# Patient Record
Sex: Female | Born: 1945
Health system: Southern US, Community
[De-identification: ages and names within clinical notes are randomized; demographics above are authoritative.]

## PROBLEM LIST (undated history)

## (undated) DIAGNOSIS — M8580 Other specified disorders of bone density and structure, unspecified site: Secondary | ICD-10-CM

## (undated) DIAGNOSIS — K759 Inflammatory liver disease, unspecified: Secondary | ICD-10-CM

## (undated) DIAGNOSIS — I10 Essential (primary) hypertension: Secondary | ICD-10-CM

## (undated) DIAGNOSIS — E079 Disorder of thyroid, unspecified: Secondary | ICD-10-CM

## (undated) DIAGNOSIS — E785 Hyperlipidemia, unspecified: Secondary | ICD-10-CM

## (undated) DIAGNOSIS — M1990 Unspecified osteoarthritis, unspecified site: Secondary | ICD-10-CM

## (undated) DIAGNOSIS — E039 Hypothyroidism, unspecified: Secondary | ICD-10-CM

## (undated) HISTORY — PX: APPENDECTOMY: SHX54

## (undated) HISTORY — DX: Other specified disorders of bone density and structure, unspecified site: M85.80

## (undated) HISTORY — DX: Disorder of thyroid, unspecified: E07.9

## (undated) HISTORY — PX: VAGINAL HYSTERECTOMY: SUR661

## (undated) HISTORY — PX: TONSILLECTOMY AND ADENOIDECTOMY: SUR1326

## (undated) HISTORY — PX: HERNIA REPAIR: SHX51

## (undated) HISTORY — DX: Hyperlipidemia, unspecified: E78.5

## (undated) HISTORY — PX: LAPAROTOMY: SHX154

## (undated) HISTORY — PX: BREAST SURGERY: SHX581

---

## 1999-03-05 ENCOUNTER — Other Ambulatory Visit: Admission: RE | Admit: 1999-03-05 | Discharge: 1999-03-05 | Payer: Self-pay | Admitting: Obstetrics and Gynecology

## 2000-03-02 ENCOUNTER — Other Ambulatory Visit: Admission: RE | Admit: 2000-03-02 | Discharge: 2000-03-02 | Payer: Self-pay | Admitting: Obstetrics and Gynecology

## 2000-03-10 ENCOUNTER — Encounter: Admission: RE | Admit: 2000-03-10 | Discharge: 2000-03-10 | Payer: Self-pay | Admitting: Internal Medicine

## 2001-10-17 ENCOUNTER — Other Ambulatory Visit: Admission: RE | Admit: 2001-10-17 | Discharge: 2001-10-17 | Payer: Self-pay | Admitting: Radiology

## 2002-03-27 ENCOUNTER — Other Ambulatory Visit: Admission: RE | Admit: 2002-03-27 | Discharge: 2002-03-27 | Payer: Self-pay | Admitting: Obstetrics and Gynecology

## 2002-09-11 ENCOUNTER — Other Ambulatory Visit: Admission: RE | Admit: 2002-09-11 | Discharge: 2002-09-11 | Payer: Self-pay | Admitting: Obstetrics and Gynecology

## 2003-03-29 ENCOUNTER — Other Ambulatory Visit: Admission: RE | Admit: 2003-03-29 | Discharge: 2003-03-29 | Payer: Self-pay | Admitting: Obstetrics and Gynecology

## 2003-07-29 ENCOUNTER — Other Ambulatory Visit: Admission: RE | Admit: 2003-07-29 | Discharge: 2003-07-29 | Payer: Self-pay | Admitting: Obstetrics and Gynecology

## 2003-11-26 ENCOUNTER — Other Ambulatory Visit: Admission: RE | Admit: 2003-11-26 | Discharge: 2003-11-26 | Payer: Self-pay | Admitting: Obstetrics and Gynecology

## 2006-05-13 ENCOUNTER — Encounter: Admission: RE | Admit: 2006-05-13 | Discharge: 2006-05-13 | Payer: Self-pay | Admitting: Family Medicine

## 2011-08-24 HISTORY — PX: HAND SURGERY: SHX662

## 2012-01-19 ENCOUNTER — Emergency Department (HOSPITAL_COMMUNITY): Payer: BC Managed Care – PPO

## 2012-01-19 ENCOUNTER — Emergency Department (HOSPITAL_COMMUNITY): Payer: BC Managed Care – PPO | Admitting: Anesthesiology

## 2012-01-19 ENCOUNTER — Ambulatory Visit (HOSPITAL_COMMUNITY)
Admission: EM | Admit: 2012-01-19 | Discharge: 2012-01-20 | Disposition: A | Discharge status: Home / Self Care | Payer: BC Managed Care – PPO | Attending: Orthopedic Surgery | Admitting: Orthopedic Surgery

## 2012-01-19 ENCOUNTER — Encounter (HOSPITAL_COMMUNITY): Payer: Self-pay | Admitting: Anesthesiology

## 2012-01-19 ENCOUNTER — Encounter (HOSPITAL_COMMUNITY): Payer: Self-pay | Admitting: *Deleted

## 2012-01-19 ENCOUNTER — Encounter (HOSPITAL_COMMUNITY)
Admission: EM | Disposition: A | Discharge status: Home / Self Care | Payer: Self-pay | Source: Home / Self Care | Attending: Emergency Medicine

## 2012-01-19 DIAGNOSIS — S65509A Unspecified injury of blood vessel of unspecified finger, initial encounter: Secondary | ICD-10-CM | POA: Insufficient documentation

## 2012-01-19 DIAGNOSIS — S61409A Unspecified open wound of unspecified hand, initial encounter: Secondary | ICD-10-CM | POA: Insufficient documentation

## 2012-01-19 DIAGNOSIS — W260XXA Contact with knife, initial encounter: Secondary | ICD-10-CM | POA: Insufficient documentation

## 2012-01-19 DIAGNOSIS — I10 Essential (primary) hypertension: Secondary | ICD-10-CM | POA: Insufficient documentation

## 2012-01-19 HISTORY — DX: Essential (primary) hypertension: I10

## 2012-01-19 HISTORY — PX: I & D EXTREMITY: SHX5045

## 2012-01-19 LAB — POCT I-STAT, CHEM 8
BUN: 15 mg/dL (ref 6–23)
Calcium, Ion: 1.21 mmol/L (ref 1.12–1.32)
Chloride: 104 mEq/L (ref 96–112)
Creatinine, Ser: 0.7 mg/dL (ref 0.50–1.10)
Glucose, Bld: 114 mg/dL — ABNORMAL HIGH (ref 70–99)
HCT: 40 % (ref 36.0–46.0)
Hemoglobin: 13.6 g/dL (ref 12.0–15.0)
Potassium: 3.5 mEq/L (ref 3.5–5.1)
Sodium: 141 mEq/L (ref 135–145)
TCO2: 27 mmol/L (ref 0–100)

## 2012-01-19 LAB — CBC
HCT: 37.3 % (ref 36.0–46.0)
Hemoglobin: 12.5 g/dL (ref 12.0–15.0)
RDW: 13.1 % (ref 11.5–15.5)
WBC: 9.6 10*3/uL (ref 4.0–10.5)

## 2012-01-19 SURGERY — IRRIGATION AND DEBRIDEMENT EXTREMITY
Anesthesia: General | Site: Hand | Laterality: Left | Wound class: Clean

## 2012-01-19 MED ORDER — DEXAMETHASONE SODIUM PHOSPHATE 4 MG/ML IJ SOLN
INTRAMUSCULAR | Status: DC | PRN
  Administered 2012-01-19: 4 mg via INTRAVENOUS

## 2012-01-19 MED ORDER — SUCCINYLCHOLINE CHLORIDE 20 MG/ML IJ SOLN
INTRAMUSCULAR | Status: DC | PRN
  Administered 2012-01-19: 40 mg via INTRAVENOUS

## 2012-01-19 MED ORDER — DROPERIDOL 2.5 MG/ML IJ SOLN
INTRAMUSCULAR | Status: DC | PRN
  Administered 2012-01-19: 0.625 mg via INTRAVENOUS

## 2012-01-19 MED ORDER — EPHEDRINE SULFATE 50 MG/ML IJ SOLN
INTRAMUSCULAR | Status: DC | PRN
  Administered 2012-01-19 (×2): 5 mg via INTRAVENOUS
  Administered 2012-01-20: 10 mg via INTRAVENOUS

## 2012-01-19 MED ORDER — PROPOFOL 10 MG/ML IV EMUL
INTRAVENOUS | Status: DC | PRN
  Administered 2012-01-19: 50 mg via INTRAVENOUS
  Administered 2012-01-19: 200 mg via INTRAVENOUS

## 2012-01-19 MED ORDER — LACTATED RINGERS IV SOLN
INTRAVENOUS | Status: DC | PRN
  Administered 2012-01-19 – 2012-01-20 (×2): via INTRAVENOUS

## 2012-01-19 MED ORDER — LIDOCAINE HCL (CARDIAC) 20 MG/ML IV SOLN
INTRAVENOUS | Status: DC | PRN
  Administered 2012-01-19: 100 mg via INTRAVENOUS

## 2012-01-19 MED ORDER — ONDANSETRON HCL 4 MG/2ML IJ SOLN
INTRAMUSCULAR | Status: DC | PRN
  Administered 2012-01-19: 4 mg via INTRAVENOUS

## 2012-01-19 MED ORDER — VANCOMYCIN HCL IN DEXTROSE 1-5 GM/200ML-% IV SOLN
1000.0000 mg | Freq: Once | INTRAVENOUS | Status: AC
  Administered 2012-01-19: 1000 mg via INTRAVENOUS
  Filled 2012-01-19: qty 200

## 2012-01-19 MED ORDER — SUFENTANIL CITRATE 50 MCG/ML IV SOLN
INTRAVENOUS | Status: DC | PRN
  Administered 2012-01-19 (×2): 20 ug via INTRAVENOUS

## 2012-01-19 MED ORDER — MIDAZOLAM HCL 5 MG/5ML IJ SOLN
INTRAMUSCULAR | Status: DC | PRN
  Administered 2012-01-19: 2 mg via INTRAVENOUS

## 2012-01-19 MED ORDER — SODIUM CHLORIDE 0.9 % IR SOLN
Status: DC | PRN
  Administered 2012-01-19: 1000 mL

## 2012-01-19 SURGICAL SUPPLY — 53 items
BANDAGE CONFORM 2  STR LF (GAUZE/BANDAGES/DRESSINGS) IMPLANT
BANDAGE ELASTIC 3 VELCRO ST LF (GAUZE/BANDAGES/DRESSINGS) ×2 IMPLANT
BANDAGE ELASTIC 4 VELCRO ST LF (GAUZE/BANDAGES/DRESSINGS) ×2 IMPLANT
BANDAGE GAUZE ELAST BULKY 4 IN (GAUZE/BANDAGES/DRESSINGS) ×4 IMPLANT
BNDG CMPR 9X4 STRL LF SNTH (GAUZE/BANDAGES/DRESSINGS) ×1
BNDG ESMARK 4X9 LF (GAUZE/BANDAGES/DRESSINGS) ×1 IMPLANT
CLOTH BEACON ORANGE TIMEOUT ST (SAFETY) ×2 IMPLANT
CORDS BIPOLAR (ELECTRODE) ×1 IMPLANT
COVER SURGICAL LIGHT HANDLE (MISCELLANEOUS) ×4 IMPLANT
CUFF TOURNIQUET SINGLE 18IN (TOURNIQUET CUFF) ×2 IMPLANT
DECANTER SPIKE VIAL GLASS SM (MISCELLANEOUS) ×2 IMPLANT
DRAIN PENROSE 1/4X12 LTX STRL (WOUND CARE) IMPLANT
DRAPE OEC MINIVIEW 54X84 (DRAPES) IMPLANT
DRAPE SURG 17X23 STRL (DRAPES) ×2 IMPLANT
DRSG PAD ABDOMINAL 8X10 ST (GAUZE/BANDAGES/DRESSINGS) ×4 IMPLANT
DURAPREP 26ML APPLICATOR (WOUND CARE) IMPLANT
ELECT REM PT RETURN 9FT ADLT (ELECTROSURGICAL)
ELECTRODE REM PT RTRN 9FT ADLT (ELECTROSURGICAL) IMPLANT
GAUZE PACKING IODOFORM 1/4X5 (PACKING) IMPLANT
GAUZE XEROFORM 1X8 LF (GAUZE/BANDAGES/DRESSINGS) ×2 IMPLANT
GLOVE BIO SURGEON STRL SZ8.5 (GLOVE) ×3 IMPLANT
GOWN SRG XL XLNG 56XLVL 4 (GOWN DISPOSABLE) ×1 IMPLANT
GOWN STRL NON-REIN LRG LVL3 (GOWN DISPOSABLE) ×2 IMPLANT
GOWN STRL NON-REIN XL XLG LVL4 (GOWN DISPOSABLE) ×2
HANDPIECE INTERPULSE COAX TIP (DISPOSABLE)
KIT BASIN OR (CUSTOM PROCEDURE TRAY) ×2 IMPLANT
KIT ROOM TURNOVER OR (KITS) ×2 IMPLANT
MANIFOLD NEPTUNE II (INSTRUMENTS) ×2 IMPLANT
NDL HYPO 25GX1X1/2 BEV (NEEDLE) IMPLANT
NDL HYPO 25X1 1.5 SAFETY (NEEDLE) ×1 IMPLANT
NEEDLE HYPO 25GX1X1/2 BEV (NEEDLE) IMPLANT
NEEDLE HYPO 25X1 1.5 SAFETY (NEEDLE) ×2 IMPLANT
NS IRRIG 1000ML POUR BTL (IV SOLUTION) ×2 IMPLANT
PACK ORTHO EXTREMITY (CUSTOM PROCEDURE TRAY) ×2 IMPLANT
PAD ARMBOARD 7.5X6 YLW CONV (MISCELLANEOUS) ×4 IMPLANT
PAD CAST 4YDX4 CTTN HI CHSV (CAST SUPPLIES) ×2 IMPLANT
PADDING CAST COTTON 4X4 STRL (CAST SUPPLIES) ×4
SET HNDPC FAN SPRY TIP SCT (DISPOSABLE) IMPLANT
SPEAR EYE SURG WECK-CEL (MISCELLANEOUS) ×2 IMPLANT
SPONGE GAUZE 4X4 12PLY (GAUZE/BANDAGES/DRESSINGS) ×4 IMPLANT
SPONGE LAP 18X18 X RAY DECT (DISPOSABLE) ×1 IMPLANT
SUCTION FRAZIER TIP 10 FR DISP (SUCTIONS) ×1 IMPLANT
SUT ETHILON 9 0 BV130 4 (SUTURE) ×1 IMPLANT
SUT VICRYL RAPIDE 4/0 PS 2 (SUTURE) ×1 IMPLANT
SYR 20CC LL (SYRINGE) IMPLANT
SYR CONTROL 10ML LL (SYRINGE) ×2 IMPLANT
TOWEL OR 17X24 6PK STRL BLUE (TOWEL DISPOSABLE) ×2 IMPLANT
TOWEL OR 17X26 10 PK STRL BLUE (TOWEL DISPOSABLE) ×2 IMPLANT
TUBE ANAEROBIC SPECIMEN COL (MISCELLANEOUS) IMPLANT
TUBE CONNECTING 12X1/4 (SUCTIONS) ×2 IMPLANT
UNDERPAD 30X30 INCONTINENT (UNDERPADS AND DIAPERS) ×2 IMPLANT
WATER STERILE IRR 1000ML POUR (IV SOLUTION) ×2 IMPLANT
YANKAUER SUCT BULB TIP NO VENT (SUCTIONS) ×3 IMPLANT

## 2012-01-19 NOTE — Anesthesia Preprocedure Evaluation (Addendum)
Anesthesia Evaluation  Patient identified by MRN, date of birth, ID band Patient awake    Reviewed: Allergy & Precautions, H&P , NPO status , Patient's Chart, lab work & pertinent test results  History of Anesthesia Complications (+) AWARENESS UNDER ANESTHESIA  Airway Mallampati: I TM Distance: >3 FB Neck ROM: full    Dental  (+) Teeth Intact   Pulmonary          Cardiovascular Exercise Tolerance: Good hypertension, Rhythm:regular Rate:Normal     Neuro/Psych    GI/Hepatic   Endo/Other    Renal/GU      Musculoskeletal   Abdominal   Peds  Hematology   Anesthesia Other Findings   Reproductive/Obstetrics                           Anesthesia Physical Anesthesia Plan  ASA: II  Anesthesia Plan: General   Post-op Pain Management:    Induction: Intravenous  Airway Management Planned: Oral ETT  Additional Equipment:   Intra-op Plan:   Post-operative Plan:   Informed Consent: I have reviewed the patients History and Physical, chart, labs and discussed the procedure including the risks, benefits and alternatives for the proposed anesthesia with the patient or authorized representative who has indicated his/her understanding and acceptance.     Plan Discussed with: CRNA, Anesthesiologist and Surgeon  Anesthesia Plan Comments:        Anesthesia Quick Evaluation

## 2012-01-19 NOTE — ED Notes (Signed)
Dressing re-enforced due to bleeding

## 2012-01-19 NOTE — ED Provider Notes (Signed)
Medical screening examination/treatment/procedure(s) were conducted as a shared visit with non-physician practitioner(s) and myself.  I personally evaluated the patient during the encounter  Bleeding persists, will have hand surgeon to eval  Toy Baker, MD 01/19/12 2149

## 2012-01-19 NOTE — ED Notes (Signed)
Pt separating hamburger, knife slipped and punctured palm left hand, bleed large amt, pressure bandage applied to control bleeding

## 2012-01-19 NOTE — ED Provider Notes (Signed)
History     CSN: [id number]  Arrival date & time 01/19/12  1946   First MD Initiated Contact with Patient 01/19/12 2015      Chief Complaint  Patient presents with  . Extremity Laceration    (Consider location/radiation/quality/duration/timing/severity/associated sxs/prior treatment) Patient is a 66 y.o. female presenting with skin laceration. The history is provided by the patient. No language interpreter was used.  Laceration  The incident occurred less than 1 hour ago. Pain location: L hand. The laceration is 3 cm in size. The laceration mechanism was a a clean knife. The pain is at a severity of 6/10. The pain is moderate. The pain has been constant since onset. Her tetanus status is UTD.   L handed patient is here today after trying to separate 2 frozen hamburgers with a butcher knife.  The knife ended up going through her hand almost all the way.  Bleeding is not controlled.  Will put pressure dressing on to control bleeding.  Tetanus up to date.  Patient is an [employer].  pmh hypertension.  Last time she ate was noon today.     Past Medical History  Diagnosis Date  . Hypertension     Past Surgical History  Procedure Date  . Appendectomy     No family history on file.  History  Substance Use Topics  . Smoking status: Never Smoker   . Smokeless tobacco: Not on file  . Alcohol Use: Yes     occ    OB History    Grav Para Term Preterm Abortions TAB SAB Ect Mult Living                  Review of Systems  Constitutional: Negative.   HENT: Negative.   Eyes: Negative.   Respiratory: Negative.   Cardiovascular: Negative.   Gastrointestinal: Negative.   Skin:       L hand lac   Neurological: Negative.   Psychiatric/Behavioral: Negative.   All other systems reviewed and are negative.    Allergies  Sulfa drugs cross reactors and Penicillins  Home Medications   Current Outpatient Rx  Name Route Sig Dispense Refill  . ASPIRIN EC 81 MG PO TBEC Oral Take 81 mg  by mouth daily.    Marland Kitchen CALCIUM CARBONATE 600 MG PO TABS Oral Take 1,200 mg by mouth daily.    Marland Kitchen FOLIC ACID 1 MG PO TABS Oral Take 1 mg by mouth daily.    Marland Kitchen HYDROCHLOROTHIAZIDE 12.5 MG PO CAPS Oral Take 12.5 mg by mouth daily.    Marland Kitchen LEVOTHYROXINE SODIUM 88 MCG PO TABS Oral Take 88 mcg by mouth daily.    . OMEGA-3-ACID ETHYL ESTERS 1 G PO CAPS Oral Take 2 g by mouth daily.      BP 167/83  Pulse 81  Temp(Src) 98.3 F (36.8 C) (Oral)  Resp 16  SpO2 100%  Physical Exam  Nursing note and vitals reviewed. Constitutional: She is oriented to person, place, and time. She appears well-developed and well-nourished.  HENT:  Head: Normocephalic and atraumatic.  Eyes: Conjunctivae and EOM are normal. Pupils are equal, round, and reactive to light.  Neck: Normal range of motion. Neck supple.  Cardiovascular: Normal rate.   Pulmonary/Chest: Effort normal.  Abdominal: Soft.  Musculoskeletal: Normal range of motion. She exhibits no edema and no tenderness.  Neurological: She is alert and oriented to person, place, and time. She has normal reflexes.  Skin: Skin is warm and dry.  3cm L hand laceration with arterial bleed Blood is squirting out.   Psychiatric: She has a normal mood and affect.    ED Course  Procedures (including critical care time)  Labs Reviewed - No data to display Dg Hand Complete Left  01/19/2012  *RADIOLOGY REPORT*  Clinical Data: The stab wound to the palmar aspect of the left hand.  Severe bleeding.  LEFT HAND - COMPLETE 3+ VIEW  Comparison: None  Findings: Overlying gauze material obscures bony detail.  The there are diffuse degenerative changes throughout the interphalangeal joints, the first metacarpal phalangeal joint, STT joints, first carpometacarpal joint, and radiocarpal joints.  No evidence of acute fracture or subluxation.  No focal bone lesion or bone destruction.  Bone cortex and trabecular architecture appear intact.  No radiopaque foreign bodies are appreciated.   IMPRESSION: Degenerative changes in the left hand.  No acute bony abnormalities.  No radiopaque soft tissue foreign bodies.  Original Report Authenticated By: Marlon Pel, M.D.     1. Laceration of hand with complication       MDM  Laceration to L palm of hand. Bleeding not controlled after pressure dressing x 1 hour and clotting medication used with no results.  Patient will be transported to [facility] OR to Dr. Mina Marble for repair.  Patient is L handed.  1gm vancomycin given in the ER.           Remi Haggard, NP 01/20/12 1331

## 2012-01-19 NOTE — ED Notes (Signed)
Dressing re enforced prior to leaving with [facility]. Pt alert and oriented, EMTALA signed.

## 2012-01-19 NOTE — Consult Note (Signed)
Reason for Consult:left hand laceration Referring Physician: MIKENZIE MCCANNON is an 66 y.o. female.  HPI: s/p deep lac to left hand with obvious arterial bleed  Past Medical History  Diagnosis Date  . Hypertension     Past Surgical History  Procedure Date  . Appendectomy     No family history on file.  Social History:  reports that she has never smoked. She does not have any smokeless tobacco history on file. She reports that she drinks alcohol. She reports that she does not use illicit drugs.  Allergies:  Allergies  Allergen Reactions  . Sulfa Drugs Cross Reactors   . Penicillins     Medications: I have reviewed the patient's current medications.  Results for orders placed during the hospital encounter of 01/19/12 (from the past 48 hour(s))  POCT I-STAT, CHEM 8     Status: Abnormal   Collection Time   01/19/12 10:57 PM      Component Value Range Comment   Sodium 141  135 - 145 (mEq/L)    Potassium 3.5  3.5 - 5.1 (mEq/L)    Chloride 104  96 - 112 (mEq/L)    BUN 15  6 - 23 (mg/dL)    Creatinine, Ser 1.61  0.50 - 1.10 (mg/dL)    Glucose, Bld 096 (*) 70 - 99 (mg/dL)    Calcium, Ion 0.45  1.12 - 1.32 (mmol/L)    TCO2 27  0 - 100 (mmol/L)    Hemoglobin 13.6  12.0 - 15.0 (g/dL)    HCT 40.9  81.1 - 91.4 (%)     Dg Hand Complete Left  01/19/2012  *RADIOLOGY REPORT*  Clinical Data: The stab wound to the palmar aspect of the left hand.  Severe bleeding.  LEFT HAND - COMPLETE 3+ VIEW  Comparison: None  Findings: Overlying gauze material obscures bony detail.  The there are diffuse degenerative changes throughout the interphalangeal joints, the first metacarpal phalangeal joint, STT joints, first carpometacarpal joint, and radiocarpal joints.  No evidence of acute fracture or subluxation.  No focal bone lesion or bone destruction.  Bone cortex and trabecular architecture appear intact.  No radiopaque foreign bodies are appreciated.  IMPRESSION: Degenerative changes in the  left hand.  No acute bony abnormalities.  No radiopaque soft tissue foreign bodies.  Original Report Authenticated By: Marlon Pel, M.D.    Review of Systems  All other systems reviewed and are negative.   Blood pressure 167/83, pulse 81, temperature 98.3 F (36.8 C), temperature source Oral, resp. rate 16, SpO2 100.00%. Physical Exam  Constitutional: She is oriented to person, place, and time. She appears well-developed and well-nourished.  HENT:  Head: Normocephalic and atraumatic.  Cardiovascular: Normal rate.   Respiratory: Effort normal.  Musculoskeletal:       Hands: Neurological: She is alert and oriented to person, place, and time.  Skin: Skin is warm.  Psychiatric: She has a normal mood and affect. Her speech is normal and behavior is normal. Thought content normal.    Assessment/Plan: As above  Plan explore and repair as needed  Pratham Cassatt A 01/19/2012, 11:05 PM

## 2012-01-20 ENCOUNTER — Encounter (HOSPITAL_COMMUNITY): Payer: Self-pay | Admitting: Orthopedic Surgery

## 2012-01-20 MED ORDER — ONDANSETRON HCL 4 MG/2ML IJ SOLN: 4.0000 mg | Freq: Once | INTRAMUSCULAR | Status: DC | PRN

## 2012-01-20 MED ORDER — BUPIVACAINE HCL (PF) 0.25 % IJ SOLN
INTRAMUSCULAR | Status: DC | PRN
  Administered 2012-01-20: 6 mL

## 2012-01-20 MED ORDER — HYDROMORPHONE HCL PF 1 MG/ML IJ SOLN: 0.2500 mg | INTRAMUSCULAR | Status: DC | PRN

## 2012-01-20 NOTE — Transfer of Care (Signed)
Immediate Anesthesia Transfer of Care Note  Patient: Kristi Jones  Procedure(s) Performed: Procedure(s) (LRB): IRRIGATION AND DEBRIDEMENT EXTREMITY (Left)  Patient Location: PACU  Anesthesia Type: General  Level of Consciousness: awake, alert , oriented, patient cooperative and responds to stimulation  Airway & Oxygen Therapy: Patient Spontanous Breathing and Patient connected to nasal cannula oxygen  Post-op Assessment: Report given to PACU RN, Post -op Vital signs reviewed and stable and Patient moving all extremities  Post vital signs: Reviewed and stable  Complications: No apparent anesthesia complications

## 2012-01-20 NOTE — Anesthesia Postprocedure Evaluation (Signed)
  Anesthesia Post-op Note  Patient: Kristi Jones  Procedure(s) Performed: Procedure(s) (LRB): IRRIGATION AND DEBRIDEMENT EXTREMITY (Left)  Patient Location: PACU  Anesthesia Type: General  Level of Consciousness: awake, oriented and patient cooperative  Airway and Oxygen Therapy: Patient Spontanous Breathing and Patient connected to nasal cannula oxygen  Post-op Pain: mild  Post-op Assessment: Post-op Vital signs reviewed, Patient's Cardiovascular Status Stable, Respiratory Function Stable, Patent Airway, No signs of Nausea or vomiting and Pain level controlled  Post-op Vital Signs: stable  Complications: No apparent anesthesia complications

## 2012-01-20 NOTE — Op Note (Signed)
Kristi Jones, Kristi Jones            ACCOUNT NO.:  [id number]  MEDICAL RECORD NO.:  [id number]  LOCATION:  [facility]                         FACILITY:  [facility]  PHYSICIAN:  Artist Pais. Zacchary Pompei, M.D.DATE OF BIRTH:  [birth year]  DATE OF PROCEDURE:  01/19/2012 DATE OF DISCHARGE:                              OPERATIVE REPORT   PREOPERATIVE DIAGNOSIS:  Deep laceration, palmar aspect, left hand.  POSTOPERATIVE DIAGNOSIS:  Deep laceration, palmar aspect, left hand.  PROCEDURE:  Exploration and microscopic repair of common digital artery to the index and long.  SURGEON:  Artist Pais. Mina Marble, M.D.  ASSISTANT:  None.  ANESTHESIA:  General.  COMPLICATIONS:  No complications.  DRAINS:  No drains.  DESCRIPTION OF PROCEDURE:  The patient was taken to the operating suite. After induction of adequate general anesthesia, left upper extremity was prepped and draped in sterile fashion.  An Esmarch was used to exsanguinate the limb.  Tourniquet was inflated to 250 mmHg.  At this point in time, an oblique incision in the palmar aspect of the left hand.  A Kaplan's cardinal line was extended proximally and distally in the area of the thenar crease, and dissection was carried down to reveal a complete laceration of the common digital artery as was coming off the superficial palmar arch to the index and long.  It was debrided of clot under loupe magnification.  The microscope was brought into the field. Under microscopic magnification, the artery was repaired with 9-0 nylon x5 sutures, the repair was good, and the wound was irrigated and then microscope was brought off the field and it was loosely closed with 4-0 Vicryl Rapide suture.  Xeroform, 4x4s, fluffs, and a volar splint was applied.  The patient tolerated the procedure well and went to the recovery room in stable fashion.     Artist Pais Mina Marble, M.D.     MAW/MEDQ  D:  01/20/2012  T:  01/20/2012  Job:  [id number]

## 2012-01-20 NOTE — Brief Op Note (Signed)
01/19/2012 - 01/20/2012  2:08 PM  PATIENT:  Delon Sacramento  66 y.o. female  PRE-OPERATIVE DIAGNOSIS:  Hand Injury  POST-OPERATIVE DIAGNOSIS:  hand injury  PROCEDURE:  Procedure(s) (LRB): IRRIGATION AND DEBRIDEMENT EXTREMITY (Left)  SURGEON:  Surgeon(s) and Role:    * Marlowe Shores, MD - Primary  PHYSICIAN ASSISTANT:   ASSISTANTS: none   ANESTHESIA:   general  EBL:     BLOOD ADMINISTERED:none  DRAINS: none   LOCAL MEDICATIONS USED:  NONE  SPECIMEN:  No Specimen  DISPOSITION OF SPECIMEN:  N/A  COUNTS:  YES  TOURNIQUET:   Total Tourniquet Time Documented: Upper Arm (Left) - 44 minutes  DICTATION: .Other Dictation: Dictation Number [id number]  PLAN OF CARE: Discharge to home after PACU  PATIENT DISPOSITION:  PACU - hemodynamically stable.   Delay start of Pharmacological VTE agent (>24hrs) due to surgical blood loss or risk of bleeding: not applicable

## 2012-01-20 NOTE — Op Note (Signed)
See dictated note [phone]

## 2012-01-20 NOTE — Anesthesia Postprocedure Evaluation (Signed)
  Anesthesia Post-op Note  Patient: Kristi Jones  Procedure(s) Performed: Procedure(s) (LRB): IRRIGATION AND DEBRIDEMENT EXTREMITY (Left)  Patient Location: PACU  Anesthesia Type: General  Level of Consciousness: awake, alert , oriented and patient cooperative  Airway and Oxygen Therapy: Patient Spontanous Breathing and Patient connected to nasal cannula oxygen  Post-op Pain: mild  Post-op Assessment: Post-op Vital signs reviewed, Patient's Cardiovascular Status Stable, Respiratory Function Stable, Patent Airway, No signs of Nausea or vomiting and Pain level controlled  Post-op Vital Signs: stable  Complications: No apparent anesthesia complications

## 2012-01-20 NOTE — Discharge Instructions (Signed)
Do not change dressing

## 2012-01-22 NOTE — ED Provider Notes (Signed)
Medical screening examination/treatment/procedure(s) were performed by non-physician practitioner and as supervising physician I was immediately available for consultation/collaboration.  Osa Fogarty T Yousuf Ager, MD 01/22/12 1510 

## 2012-06-15 ENCOUNTER — Encounter (INDEPENDENT_AMBULATORY_CARE_PROVIDER_SITE_OTHER): Payer: Self-pay | Admitting: Surgery

## 2012-06-15 ENCOUNTER — Ambulatory Visit (INDEPENDENT_AMBULATORY_CARE_PROVIDER_SITE_OTHER): Payer: BC Managed Care – PPO | Admitting: Surgery

## 2012-06-15 VITALS — BP 132/82 | HR 63 | Temp 98.7°F | Resp 18 | Ht 66.0 in | Wt 143.6 lb

## 2012-06-15 DIAGNOSIS — L723 Sebaceous cyst: Secondary | ICD-10-CM

## 2012-06-15 NOTE — Progress Notes (Signed)
[facility]  Ovidio Kin, MD,  FACS [address] [place]  [address] [place], [place]    [address] Phone:  [phone] FAX:  [phone]   Re:   ANGELIN Jones DOB:   04-Nov-1962 MRN:   [id number]  ASSESSMENT AND PLAN: 1.  Sebaceous cyst, Left shoulder.  Discussed indications and complication of removing the cyst explained to the patient.  I gave her a sheet on sebaceous cysts.  [facility].  2. HTN 3.  Thyroid replacement  HISTORY OF PRESENT ILLNESS: Chief Complaint  Patient presents with  . New Evaluation    seb. cyst    Kristi Jones is a 66 y.o. (DOB: October 13, 1945)  white female who is a patient of DEWEY,ELIZABETH, MD and comes to me today for cyst of her back  I actually saw her in Sept. 2005 for an infected cyst of left shoulder.  For some reason, she never followed up with me.  This cyst healed, but then slowly got enlarged again.  Then it recently drained, so she is at a question of what to do about it.  I went over sebaceous cysts with her.  PHYSICAL EXAM: BP 132/82  Pulse 63  Temp 98.7 F (37.1 C) (Oral)  Resp 18  Ht 5\' 6"  (1.676 m)  Wt 143 lb 9.6 oz (65.137 kg)  BMI 23.18 kg/m2  Back:  2 cm cyst off left shoulder.  Not infected.  DATA REVIEWED: Notes from Cameron Sprang (with Dr. Duanne Guess)  Ovidio Kin, MD, FACS Office:  [phone]

## 2012-06-16 LAB — HM COLONOSCOPY

## 2012-06-21 ENCOUNTER — Other Ambulatory Visit (INDEPENDENT_AMBULATORY_CARE_PROVIDER_SITE_OTHER): Payer: Self-pay | Admitting: Surgery

## 2012-06-21 DIAGNOSIS — L723 Sebaceous cyst: Secondary | ICD-10-CM

## 2012-07-13 ENCOUNTER — Ambulatory Visit (INDEPENDENT_AMBULATORY_CARE_PROVIDER_SITE_OTHER): Payer: BC Managed Care – PPO | Admitting: Surgery

## 2012-07-13 ENCOUNTER — Encounter (INDEPENDENT_AMBULATORY_CARE_PROVIDER_SITE_OTHER): Payer: Self-pay | Admitting: Surgery

## 2012-07-13 VITALS — BP 124/74 | HR 68 | Temp 98.6°F | Resp 18 | Ht 66.0 in | Wt 142.0 lb

## 2012-07-13 DIAGNOSIS — L723 Sebaceous cyst: Secondary | ICD-10-CM

## 2012-07-13 NOTE — Progress Notes (Signed)
Post Op Visit  Excision of cyst of left shoulder on 06/21/2012 at the [facility]. Benign cyst - path to patient.  BP 124/74  Pulse 68  Temp 98.6 F (37 C) (Oral)  Resp 18  Ht 5\' 6"  (1.676 m)  Wt 142 lb (64.411 kg)  BMI 22.92 kg/m2 Doing well.  Return PRN.  Ovidio Kin, MD, [facility] Surgery Pager: [phone] Office phone:  [phone]

## 2013-10-31 DIAGNOSIS — I10 Essential (primary) hypertension: Secondary | ICD-10-CM | POA: Insufficient documentation

## 2013-10-31 DIAGNOSIS — E039 Hypothyroidism, unspecified: Secondary | ICD-10-CM | POA: Insufficient documentation

## 2013-10-31 DIAGNOSIS — M810 Age-related osteoporosis without current pathological fracture: Secondary | ICD-10-CM | POA: Insufficient documentation

## 2013-10-31 DIAGNOSIS — M8580 Other specified disorders of bone density and structure, unspecified site: Secondary | ICD-10-CM

## 2013-10-31 HISTORY — DX: Other specified disorders of bone density and structure, unspecified site: M85.80

## 2014-05-10 DIAGNOSIS — E785 Hyperlipidemia, unspecified: Secondary | ICD-10-CM | POA: Insufficient documentation

## 2014-05-10 DIAGNOSIS — E782 Mixed hyperlipidemia: Secondary | ICD-10-CM | POA: Insufficient documentation

## 2014-11-08 DIAGNOSIS — D242 Benign neoplasm of left breast: Secondary | ICD-10-CM | POA: Insufficient documentation

## 2016-02-19 LAB — HM HEPATITIS C SCREENING LAB: HM Hepatitis Screen: NEGATIVE

## 2017-01-24 DIAGNOSIS — I10 Essential (primary) hypertension: Secondary | ICD-10-CM | POA: Diagnosis not present

## 2017-01-24 DIAGNOSIS — M810 Age-related osteoporosis without current pathological fracture: Secondary | ICD-10-CM | POA: Diagnosis not present

## 2017-01-24 DIAGNOSIS — E782 Mixed hyperlipidemia: Secondary | ICD-10-CM | POA: Diagnosis not present

## 2017-01-24 DIAGNOSIS — E039 Hypothyroidism, unspecified: Secondary | ICD-10-CM | POA: Diagnosis not present

## 2017-01-31 DIAGNOSIS — H2513 Age-related nuclear cataract, bilateral: Secondary | ICD-10-CM | POA: Diagnosis not present

## 2017-02-11 ENCOUNTER — Encounter: Payer: Self-pay | Admitting: Family Medicine

## 2017-02-11 DIAGNOSIS — Z803 Family history of malignant neoplasm of breast: Secondary | ICD-10-CM | POA: Diagnosis not present

## 2017-02-11 DIAGNOSIS — M8589 Other specified disorders of bone density and structure, multiple sites: Secondary | ICD-10-CM | POA: Diagnosis not present

## 2017-02-11 DIAGNOSIS — Z1231 Encounter for screening mammogram for malignant neoplasm of breast: Secondary | ICD-10-CM | POA: Diagnosis not present

## 2017-02-24 DIAGNOSIS — R05 Cough: Secondary | ICD-10-CM | POA: Diagnosis not present

## 2017-02-24 DIAGNOSIS — I10 Essential (primary) hypertension: Secondary | ICD-10-CM | POA: Diagnosis not present

## 2017-05-05 DIAGNOSIS — I10 Essential (primary) hypertension: Secondary | ICD-10-CM | POA: Diagnosis not present

## 2017-05-18 LAB — HM DEXA SCAN

## 2017-05-18 LAB — HM MAMMOGRAPHY

## 2017-06-09 DIAGNOSIS — H35369 Drusen (degenerative) of macula, unspecified eye: Secondary | ICD-10-CM | POA: Diagnosis not present

## 2017-06-09 DIAGNOSIS — H2513 Age-related nuclear cataract, bilateral: Secondary | ICD-10-CM | POA: Diagnosis not present

## 2017-06-09 DIAGNOSIS — H04123 Dry eye syndrome of bilateral lacrimal glands: Secondary | ICD-10-CM | POA: Diagnosis not present

## 2017-09-27 DIAGNOSIS — I10 Essential (primary) hypertension: Secondary | ICD-10-CM | POA: Diagnosis not present

## 2017-09-27 DIAGNOSIS — Z0000 Encounter for general adult medical examination without abnormal findings: Secondary | ICD-10-CM | POA: Diagnosis not present

## 2018-01-10 ENCOUNTER — Ambulatory Visit (INDEPENDENT_AMBULATORY_CARE_PROVIDER_SITE_OTHER): Payer: Medicare Other | Admitting: Family Medicine

## 2018-01-10 ENCOUNTER — Encounter: Payer: Self-pay | Admitting: Family Medicine

## 2018-01-10 ENCOUNTER — Other Ambulatory Visit: Payer: Self-pay

## 2018-01-10 VITALS — BP 122/78 | HR 89 | Temp 98.8°F | Ht 65.0 in | Wt 140.4 lb

## 2018-01-10 DIAGNOSIS — E782 Mixed hyperlipidemia: Secondary | ICD-10-CM

## 2018-01-10 DIAGNOSIS — E039 Hypothyroidism, unspecified: Secondary | ICD-10-CM | POA: Diagnosis not present

## 2018-01-10 DIAGNOSIS — I10 Essential (primary) hypertension: Secondary | ICD-10-CM

## 2018-01-10 DIAGNOSIS — M810 Age-related osteoporosis without current pathological fracture: Secondary | ICD-10-CM

## 2018-01-10 LAB — CBC WITH DIFFERENTIAL/PLATELET
BASOS PCT: 0.4 % (ref 0.0–3.0)
Basophils Absolute: 0 10*3/uL (ref 0.0–0.1)
EOS PCT: 3.1 % (ref 0.0–5.0)
Eosinophils Absolute: 0.3 10*3/uL (ref 0.0–0.7)
HCT: 41.1 % (ref 36.0–46.0)
Hemoglobin: 13.9 g/dL (ref 12.0–15.0)
LYMPHS PCT: 18.6 % (ref 12.0–46.0)
Lymphs Abs: 1.5 10*3/uL (ref 0.7–4.0)
MCHC: 33.7 g/dL (ref 30.0–36.0)
MCV: 87.1 fl (ref 78.0–100.0)
MONOS PCT: 5.7 % (ref 3.0–12.0)
Monocytes Absolute: 0.5 10*3/uL (ref 0.1–1.0)
NEUTROS ABS: 5.9 10*3/uL (ref 1.4–7.7)
NEUTROS PCT: 72.2 % (ref 43.0–77.0)
Platelets: 219 10*3/uL (ref 150.0–400.0)
RBC: 4.72 Mil/uL (ref 3.87–5.11)
RDW: 14 % (ref 11.5–15.5)
WBC: 8.1 10*3/uL (ref 4.0–10.5)

## 2018-01-10 LAB — LIPID PANEL
Cholesterol: 158 mg/dL (ref 0–200)
HDL: 64 mg/dL (ref >39.00)
LDL Cholesterol: 76 mg/dL (ref 0–99)
NonHDL: 93.71
TRIGLYCERIDES: 89 mg/dL (ref 0.0–149.0)
Total CHOL/HDL Ratio: 2
VLDL: 17.8 mg/dL (ref 0.0–40.0)

## 2018-01-10 LAB — COMPREHENSIVE METABOLIC PANEL
ALBUMIN: 4.4 g/dL (ref 3.5–5.2)
ALT: 11 U/L (ref 0–35)
AST: 14 U/L (ref 0–37)
Alkaline Phosphatase: 48 U/L (ref 39–117)
BILIRUBIN TOTAL: 0.4 mg/dL (ref 0.2–1.2)
BUN: 21 mg/dL (ref 6–23)
CALCIUM: 9.9 mg/dL (ref 8.4–10.5)
CO2: 31 meq/L (ref 19–32)
CREATININE: 0.69 mg/dL (ref 0.40–1.20)
Chloride: 102 mEq/L (ref 96–112)
GFR: 89.1 mL/min (ref >60.00)
Glucose, Bld: 103 mg/dL — ABNORMAL HIGH (ref 70–99)
Potassium: 3.8 mEq/L (ref 3.5–5.1)
Sodium: 142 mEq/L (ref 135–145)
Total Protein: 7 g/dL (ref 6.0–8.3)

## 2018-01-10 LAB — VITAMIN D 25 HYDROXY (VIT D DEFICIENCY, FRACTURES): VITD: 30.89 ng/mL (ref 30.00–100.00)

## 2018-01-10 LAB — TSH: TSH: 1.78 u[IU]/mL (ref 0.35–4.50)

## 2018-01-10 NOTE — Progress Notes (Signed)
Subjective  Chief Complaint  Patient presents with  . Establish Care    Transfer from [place], last physical 01/24/2017, doing well,     HPI: Kristi Jones is a 72 y.o. female who presents to [place] at [facility] today for a Female Wellness Visit. She also has the concerns and/or needs as listed above in the chief complaint. These will be addressed in addition to the Health Maintenance Visit.  Had AWV 09/2017;  Wellness Visit: annual visit with health maintenance review and exam without Pap   I reviewed notes from [facility]. Had labs 01/2017  HTN: now controlled on losartan 50; had cough with lisinopril and dry mouth with amlodipine. No sxs of CAD or CHF.   HLD: doing well on low dose lipitor. For recheck today; nonfasting.   Hypothyroidism: stable clinically on meds. Due for recheck.   Body mass index is 23.36 kg/m. Wt Readings from Last 3 Encounters:  01/10/18 140 lb 6.4 oz (63.7 kg)  07/13/12 142 lb (64.4 kg)  06/15/12 143 lb 9.6 oz (65.1 kg)   BP Readings from Last 3 Encounters:  01/10/18 122/78  07/13/12 124/74  06/15/12 132/82    Diet: low fat Exercise: frequently, cardiovascular workout on exercise equipment and walking   Patient Active Problem List   Diagnosis Date Noted  . Hyperlipidemia 05/10/2014    Priority: High    Failed simvastatin due to myalgias   . HTN (hypertension), benign 10/31/2013    Priority: High  . Hypothyroidism 10/31/2013    Priority: High  . Breast fibroadenoma, left 11/08/2014    Priority: Medium    Left subareolar.  Status post biopsy   . Age-related osteoporosis without current pathological fracture 10/31/2013    Priority: Medium    DEXA 01/2013 - T - -2.3 with elevated FRAX of 20.9%; rec consideration of fosamax - pt declines now 04/2014/cla DEXA 11/2014 T= - 2.5 with elevated FRZS of 23% and elevated hip frac 4% - Fosamax started/cla DEXA 01/2017: T=-2.2, lowest; 4% improved in femur, 11% improvement in spine  on fosamax/ 12/2017   . Sebaceous cyst, upper back 06/15/2012    Priority: Low   Health Maintenance  Topic Date Due  . PNA vac Low Risk Adult (1 of 2 - PCV13) 01/11/2019 (Originally 11/09/2011)  . INFLUENZA VACCINE  03/23/2018  . MAMMOGRAM  05/19/2019  . DEXA SCAN  05/18/2020  . TETANUS/TDAP  11/08/2020  . COLONOSCOPY  06/16/2022  . Hepatitis C Screening  Completed   Immunization History  Administered Date(s) Administered  . Influenza, High Dose Seasonal PF 06/04/2015  . Tdap 11/09/2010   We updated and reviewed the patient's past history in detail and it is documented below. Allergies: Patient is allergic to sulfa antibiotics; sulfa drugs cross reactors; simvastatin; penicillins; and lisinopril. Past Medical History Patient  has a past medical history of Hyperlipemia, Hypertension, Osteopenia (10/31/2013), and Thyroid disease. Past Surgical History Patient  has a past surgical history that includes I&D extremity (01/19/2012); Appendectomy; Hand surgery (2013); Vaginal hysterectomy; and Tonsillectomy and adenoidectomy. Family History: Patient family history includes Alzheimer's disease in her mother; Diabetes in her mother; Heart disease in her father and mother; Hyperlipidemia in her brother; Hypertension in her mother. Social History:  Patient  reports that she has never smoked. She has never used smokeless tobacco. She reports that she drinks alcohol. She reports that she does not use drugs.  Review of Systems: Constitutional: negative for fever or malaise Ophthalmic: negative for photophobia, double vision or  loss of vision Cardiovascular: negative for chest pain, dyspnea on exertion, or new LE swelling Respiratory: negative for SOB or persistent cough Gastrointestinal: negative for abdominal pain, change in bowel habits or melena Genitourinary: negative for dysuria or gross hematuria, no abnormal uterine bleeding or disharge Musculoskeletal: negative for new gait disturbance  or muscular weakness Integumentary: negative for new or persistent rashes, no breast lumps Neurological: negative for TIA or stroke symptoms Psychiatric: negative for SI or delusions Allergic/Immunologic: negative for hives  Patient Care Team    Relationship Specialty Notifications Start End  Leamon Arnt, MD PCP - General Family Medicine  01/10/18     Objective  Vitals: BP 122/78   Pulse 89   Temp 98.8 F (37.1 C)   Ht 5\' 5"  (1.651 m)   Wt 140 lb 6.4 oz (63.7 kg)   BMI 23.36 kg/m  General:  Well developed, well nourished, no acute distress  Psych:  Alert and orientedx3,normal mood and affect HEENT:  Normocephalic, atraumatic, non-icteric sclera, PERRL, oropharynx is clear without mass or exudate, supple neck without adenopathy, mass or thyromegaly Cardiovascular:  Normal S1, S2, RRR without gallop, rub or murmur, nondisplaced PMI Respiratory:  Good breath sounds bilaterally, CTAB with normal respiratory effort Gastrointestinal: normal bowel sounds, soft, non-tender, no noted masses. No HSM MSK: no deformities, contusions. Joints are without erythema or swelling. Spine and CVA region are nontender Skin:  Warm, no rashes or suspicious lesions noted Neurologic:    Mental status is normal. CN 2-11 are normal. Gross motor and sensory exams are normal. Normal gait. No tremor Breast Exam: No mass, skin retraction or nipple discharge is appreciated in either breast. No axillary adenopathy. Fibrocystic changes are not noted  Assessment  1. HTN (hypertension), benign   2. Mixed hyperlipidemia   3. Acquired hypothyroidism   4. Age-related osteoporosis without current pathological fracture      Plan  Female Wellness Visit:  Age appropriate Health Maintenance and Prevention measures were discussed with patient. Included topics are cancer screening recommendations, ways to keep healthy (see AVS) including dietary and exercise recommendations, regular eye and dental care, use of seat  belts, and avoidance of moderate alcohol use and tobacco use.   BMI: discussed patient's BMI and encouraged positive lifestyle modifications to help get to or maintain a target BMI.  HM needs and immunizations were addressed and ordered. See below for orders. See HM and immunization section for updates.pt declines pneumonia vaccinations at this time. Counseling given.  Routine labs and screening tests ordered including cmp, cbc and lipids where appropriate.  Discussed recommendations regarding Vit D and calcium supplementation (see AVS)  Chronic disease f/u and/or acute problem visit: (deemed necessary to be done in addition to the wellness visit):  HTN: well controlled. Check renal function and electrolytes.  HLD: check nonfasting lipids on statin. Check lfts  Thyroid: clinically stable. Check TSH  Osteoporosis: improved on year 3 of fosamax. No AEs. Recheck dexa in 2-3 years. Check calcium and vit D. rec supplements and weight bearing exercises.   Follow up: Return in about 1 year (around 01/11/2019) for complete physical, AWV 09/2018.   Commons side effects, risks, benefits, and alternatives for medications and treatment plan prescribed today were discussed, and the patient expressed understanding of the given instructions. Patient is instructed to call or message via MyChart if he/she has any questions or concerns regarding our treatment plan. No barriers to understanding were identified. We discussed Red Flag symptoms and signs in detail. Patient expressed understanding  regarding what to do in case of urgent or emergency type symptoms.   Medication list was reconciled, printed and provided to the patient in AVS. Patient instructions and summary information was reviewed with the patient as documented in the AVS. This note was prepared with assistance of Dragon voice recognition software. Occasional wrong-word or sound-a-like substitutions may have occurred due to the inherent limitations  of voice recognition software  Orders Placed This Encounter  Procedures  . HM MAMMOGRAPHY  . HM DEXA SCAN  . HM HEPATITIS C SCREENING LAB  . Comprehensive metabolic panel  . TSH  . Lipid panel  . CBC with Differential/Platelet  . VITAMIN D 25 Hydroxy (Vit-D Deficiency, Fractures)  . HM COLONOSCOPY   No orders of the defined types were placed in this encounter.

## 2018-01-10 NOTE — Patient Instructions (Signed)
It was so good seeing you again! Thank you for establishing with my new practice and allowing me to continue caring for you. It means a lot to me.   Please schedule a follow up appointment with me in 12 months for your annual complete physical; please come fasting.  Your AWV will be due next February.   Please do these things to maintain good health!   Exercise at least 30-45 minutes a day,  4-5 days a week.   Eat a low-fat diet with lots of fruits and vegetables, up to 7-9 servings per day.  Drink plenty of water daily. Try to drink 8 8oz glasses per day.  Seatbelts can save your life. Always wear your seatbelt.  Place Smoke Detectors on every level of your home and check batteries every year.  Schedule an appointment with an eye doctor for an eye exam every 1-2 years  Safe sex - use condoms to protect yourself from STDs if you could be exposed to these types of infections. Use birth control if you do not want to become pregnant and are sexually active.  Avoid heavy alcohol use. If you drink, keep it to less than 2 drinks/day and not every day.  [place].  Choose someone you trust that could speak for you if you became unable to speak for yourself.  Depression is common in our stressful world.If you're feeling down or losing interest in things you normally enjoy, please come in for a visit.  If anyone is threatening or hurting you, please get help. Physical or Emotional Violence is never OK.

## 2018-01-13 ENCOUNTER — Other Ambulatory Visit: Payer: Self-pay | Admitting: Family Medicine

## 2018-01-17 NOTE — Telephone Encounter (Signed)
Please call her to find out why she takes this?  Can take otc supplement if she'd like.  Or did she have a h/o folic acid deficiency.

## 2018-01-17 NOTE — Telephone Encounter (Signed)
Prescription filled by Historical Provider. Last seen here 01/10/2018. Please advise.   Doloris Hall,  LPN

## 2018-01-17 NOTE — Telephone Encounter (Signed)
LM to  RC, CRM Created  

## 2018-01-17 NOTE — Telephone Encounter (Signed)
Pt states she has been taking this for years.  Not due to a h/o folic acid deficiency. Pt states  she had a cervical erosion, and was prescribed this at that time by obgyn.. The OBGYN advised her to take this as it would be helpful, and pt states it has, she has not had any problem since then.  The OTC dosage is not as much as this and pt ends up having several pills at a time.

## 2018-02-01 ENCOUNTER — Other Ambulatory Visit: Payer: Self-pay | Admitting: Family Medicine

## 2018-02-02 ENCOUNTER — Other Ambulatory Visit: Payer: Self-pay

## 2018-02-02 MED ORDER — ROSUVASTATIN CALCIUM 5 MG PO TABS: 5.0000 mg | ORAL_TABLET | Freq: Every day | ORAL | 3 refills | Status: DC

## 2018-02-16 DIAGNOSIS — Z1231 Encounter for screening mammogram for malignant neoplasm of breast: Secondary | ICD-10-CM | POA: Diagnosis not present

## 2018-02-16 DIAGNOSIS — Z803 Family history of malignant neoplasm of breast: Secondary | ICD-10-CM | POA: Diagnosis not present

## 2018-02-16 LAB — HM MAMMOGRAPHY

## 2018-02-20 ENCOUNTER — Encounter: Payer: Self-pay | Admitting: Emergency Medicine

## 2018-03-20 ENCOUNTER — Other Ambulatory Visit: Payer: Self-pay | Admitting: Family Medicine

## 2018-03-23 DIAGNOSIS — H52223 Regular astigmatism, bilateral: Secondary | ICD-10-CM | POA: Diagnosis not present

## 2018-03-23 DIAGNOSIS — H524 Presbyopia: Secondary | ICD-10-CM | POA: Diagnosis not present

## 2018-03-23 DIAGNOSIS — H2513 Age-related nuclear cataract, bilateral: Secondary | ICD-10-CM | POA: Diagnosis not present

## 2018-03-23 DIAGNOSIS — H5203 Hypermetropia, bilateral: Secondary | ICD-10-CM | POA: Diagnosis not present

## 2018-04-02 ENCOUNTER — Other Ambulatory Visit: Payer: Self-pay | Admitting: Family Medicine

## 2018-05-08 ENCOUNTER — Other Ambulatory Visit: Payer: Self-pay | Admitting: Family Medicine

## 2018-07-12 ENCOUNTER — Other Ambulatory Visit: Payer: Self-pay | Admitting: Family Medicine

## 2018-07-19 ENCOUNTER — Other Ambulatory Visit: Payer: Self-pay | Admitting: Family Medicine

## 2018-09-27 NOTE — Progress Notes (Signed)
Subjective:   Kristi Jones is a 73 y.o. female who presents for Medicare Annual (Subsequent) preventive examination.  Review of Systems:  No ROS.  Medicare Wellness Visit. Additional risk factors are reflected in the social history.  Cardiac Risk Factors include: advanced age (>65men, >43 women);dyslipidemia;hypertension;family history of premature cardiovascular disease   Sleep patterns: Sleeps 7-8 hours.  Home Safety/Smoke Alarms: Feels safe in home. Smoke alarms in place.  Living environment; residence and Firearm Safety: Lives with husband in 1 story home.  Seat Belt Safety/Bike Helmet: Wears seat belt.   Female:   Pap-N/A       Mammo-02/16/2018, benign. Ordered today. [facility]       Dexa scan-02/11/2017, osteopenia.   Ordered today. [facility]      [facility]-Colonoscopy 06/16/2012, pt reports normal. Recall 10 years. Dr. Collene Mares      Objective:     Vitals: BP 134/70 (BP Location: Left Arm, Patient Position: Sitting, Cuff Size: Normal)   Pulse 62   Ht 5\' 5"  (1.651 m)   Wt 139 lb (63 kg)   SpO2 98%   BMI 23.13 kg/m   Body mass index is 23.13 kg/m.  Advanced Directives 09/28/2018  Does Patient Have a Medical Advance Directive? Yes  Type of Paramedic of [place];Living will  Copy of [place] in Chart? No - copy requested    Tobacco Social History   Tobacco Use  Smoking Status Never Smoker  Smokeless Tobacco Never Used     Counseling given: Not Answered    Past Medical History:  Diagnosis Date  . Hyperlipemia   . Hypertension   . Osteopenia 10/31/2013   DEXA 01/2013- T+ -2.3 with elvated FRAX of 20.9%, rec consideration of [place]- patient declines now 04/2014/cla  . Thyroid disease    Past Surgical History:  Procedure Laterality Date  . APPENDECTOMY    . HAND SURGERY  2013   left hand due to trauma  . I&D EXTREMITY  01/19/2012   Procedure: IRRIGATION AND DEBRIDEMENT EXTREMITY;  Surgeon: Schuyler Amor, MD;   Location: [facility];  Service: Orthopedics;  Laterality: Left;  with common digital artery repair  . TONSILLECTOMY AND ADENOIDECTOMY    . VAGINAL HYSTERECTOMY     vaginal, partial, DUB    Family History  Problem Relation Age of Onset  . Alzheimer's disease Mother   . Diabetes Mother   . Heart disease Mother        CHF   . Hypertension Mother   . Heart disease Father   . Breast cancer Sister   . Hyperlipidemia Brother    Social History   Socioeconomic History  . Marital status: Married    Spouse name: Not on file  . Number of children: Not on file  . Years of education: Not on file  . Highest education level: Not on file  Occupational History  . Not on file  Social Needs  . Financial resource strain: Not on file  . Food insecurity:    Worry: Not on file    Inability: Not on file  . Transportation needs:    Medical: Not on file    Non-medical: Not on file  Tobacco Use  . Smoking status: Never Smoker  . Smokeless tobacco: Never Used  Substance and Sexual Activity  . Alcohol use: Yes    Comment: occ  . Drug use: No  . Sexual activity: Yes  Lifestyle  . Physical activity:    Days per week:  Not on file    Minutes per session: Not on file  . Stress: Not on file  Relationships  . Social connections:    Talks on phone: Not on file    Gets together: Not on file    Attends religious service: Not on file    Active member of club or organization: Not on file    Attends meetings of clubs or organizations: Not on file    Relationship status: Not on file  Other Topics Concern  . Not on file  Social History Narrative  . Not on file    Outpatient Encounter Medications as of 09/28/2018  Medication Sig  . alendronate (FOSAMAX) 70 MG tablet TAKE 1 TAB ONCE A WEEK, AT LEAST 30 MIN BEFORE 1ST FOOD.DO NOT LIE DOWN FOR 30 MIN AFTER TAKING.  Marland Kitchen aspirin EC 81 MG tablet Take 81 mg by mouth daily.  . folic acid (FOLVITE) 1 MG tablet TAKE 1 TABLET ONCE DAILY.  . hydrochlorothiazide  (HYDRODIURIL) 25 MG tablet TAKE 1 TABLET IN THE MORNING.  Marland Kitchen levothyroxine (SYNTHROID, LEVOTHROID) 88 MCG tablet TAKE 1 TABLET ONCE DAILY.  Marland Kitchen losartan (COZAAR) 50 MG tablet Take 1 tablet (50 mg total) by mouth daily.  . rosuvastatin (CRESTOR) 5 MG tablet TAKE ONE TABLET AT BEDTIME.   No facility-administered encounter medications on file as of 09/28/2018.     Activities of Daily Living In your present state of health, do you have any difficulty performing the following activities: 09/28/2018  Hearing? N  Vision? N  Difficulty concentrating or making decisions? N  Walking or climbing stairs? N  Dressing or bathing? N  Doing errands, shopping? N  Preparing Food and eating ? N  Using the Toilet? N  In the past six months, have you accidently leaked urine? N  Do you have problems with loss of bowel control? N  Managing your Medications? N  Managing your Finances? N  Housekeeping or managing your Housekeeping? N  Some recent data might be hidden    Patient Care Team: Leamon Arnt, MD as PCP - General (Family Medicine) Dermatology, [facility]    Assessment:   This is a routine wellness examination for [name].  Exercise Activities and Dietary recommendations Current Exercise Habits: Structured exercise class, Type of exercise: strength training/weights;Other - see comments(swim, water aerobics. water zumba), Time (Minutes): 60, Frequency (Times/Week): 5, Weekly Exercise (Minutes/Week): 300, Exercise limited by: None identified   Diet (meal preparation, eat out, water intake, caffeinated beverages, dairy products, fruits and vegetables): Drinks water  Breakfast: coffee, OJ, bread/pb Lunch: skips; yogurt Dinner: meat; vegetables; salad; pasta  Goals    . Weight (lb) < 135 lb (61.2 kg)     Lose weight by decreasing carb intake.        Fall Risk Fall Risk  09/28/2018 01/10/2018  Falls in the past year? 0 No    Depression Screen PHQ 2/9 Scores 09/28/2018 01/10/2018  PHQ - 2  Score 0 0  PHQ- 9 Score - 0     Cognitive Function MMSE - Mini Mental State Exam 09/28/2018  Orientation to time 5  Orientation to Place 5  Registration 3  Attention/ Calculation 5  Recall 3  Language- name 2 objects 2  Language- repeat 1  Language- follow 3 step command 3  Language- read & follow direction 1  Write a sentence 1  Copy design 1  Total score 30        Immunization History  Administered Date(s) Administered  .  Influenza, High Dose Seasonal PF 06/04/2015  . Tdap 11/09/2010     Screening Tests Health Maintenance  Topic Date Due  . INFLUENZA VACCINE  03/23/2018  . PNA vac Low Risk Adult (1 of 2 - PCV13) 01/11/2019 (Originally 11/09/2011)  . MAMMOGRAM  02/17/2020  . DEXA SCAN  05/18/2020  . TETANUS/TDAP  11/08/2020  . COLONOSCOPY  06/16/2022  . Hepatitis C Screening  Completed        Plan:     Schedule mammogram and DEXA after 02/13/2019.   Bring a copy of your living will and/or healthcare power of attorney to your next office visit.  Continue doing brain stimulating activities (puzzles, reading, adult coloring books, staying active) to keep memory sharp.   I have personally reviewed and noted the following in the patient's chart:   . Medical and social history . Use of alcohol, tobacco or illicit drugs  . Current medications and supplements . Functional ability and status . Nutritional status . Physical activity . Advanced directives . List of other physicians . Hospitalizations, surgeries, and ER visits in previous 12 months . Vitals . Screenings to include cognitive, depression, and falls . Referrals and appointments  In addition, I have reviewed and discussed with patient certain preventive protocols, quality metrics, and best practice recommendations. A written personalized care plan for preventive services as well as general preventive health recommendations were provided to patient.     Gerilyn Nestle, RN  09/28/2018  F/U with  PCP 01/2019

## 2018-09-28 ENCOUNTER — Other Ambulatory Visit: Payer: Self-pay

## 2018-09-28 ENCOUNTER — Ambulatory Visit (INDEPENDENT_AMBULATORY_CARE_PROVIDER_SITE_OTHER): Payer: Medicare Other

## 2018-09-28 ENCOUNTER — Ambulatory Visit: Payer: Medicare Other

## 2018-09-28 VITALS — BP 134/70 | HR 62 | Ht 65.0 in | Wt 139.0 lb

## 2018-09-28 DIAGNOSIS — E2839 Other primary ovarian failure: Secondary | ICD-10-CM | POA: Diagnosis not present

## 2018-09-28 DIAGNOSIS — Z1231 Encounter for screening mammogram for malignant neoplasm of breast: Secondary | ICD-10-CM

## 2018-09-28 DIAGNOSIS — Z0000 Encounter for general adult medical examination without abnormal findings: Secondary | ICD-10-CM | POA: Diagnosis not present

## 2018-09-28 NOTE — Patient Instructions (Addendum)
Schedule mammogram and DEXA after 02/13/2019.   Bring a copy of your living will and/or healthcare power of attorney to your next office visit.  Continue doing brain stimulating activities (puzzles, reading, adult coloring books, staying active) to keep memory sharp.   Health Maintenance, Female Adopting a healthy lifestyle and getting preventive care can go a long way to promote health and wellness. Talk with your health care provider about what schedule of regular examinations is right for you. This is a good chance for you to check in with your provider about disease prevention and staying healthy. In between checkups, there are plenty of things you can do on your own. Experts have done a lot of research about which lifestyle changes and preventive measures are most likely to keep you healthy. Ask your health care provider for more information. Weight and diet Eat a healthy diet  Be sure to include plenty of vegetables, fruits, low-fat dairy products, and lean protein.  Do not eat a lot of foods high in solid fats, added sugars, or salt.  Get regular exercise. This is one of the most important things you can do for your health. ? Most adults should exercise for at least 150 minutes each week. The exercise should increase your heart rate and make you sweat (moderate-intensity exercise). ? Most adults should also do strengthening exercises at least twice a week. This is in addition to the moderate-intensity exercise. Maintain a healthy weight  Body mass index (BMI) is a measurement that can be used to identify possible weight problems. It estimates body fat based on height and weight. Your health care provider can help determine your BMI and help you achieve or maintain a healthy weight.  For females 33 years of age and older: ? A BMI below 18.5 is considered underweight. ? A BMI of 18.5 to 24.9 is normal. ? A BMI of 25 to 29.9 is considered overweight. ? A BMI of 30 and above is  considered obese. Watch levels of cholesterol and blood lipids  You should start having your blood tested for lipids and cholesterol at 72 years of age, then have this test every 5 years.  You may need to have your cholesterol levels checked more often if: ? Your lipid or cholesterol levels are high. ? You are older than 72 years of age. ? You are at high risk for heart disease. Cancer screening Lung Cancer  Lung cancer screening is recommended for adults 61-40 years old who are at high risk for lung cancer because of a history of smoking.  A yearly low-dose CT scan of the lungs is recommended for people who: ? Currently smoke. ? Have quit within the past 15 years. ? Have at least a 30-pack-year history of smoking. A pack year is smoking an average of one pack of cigarettes a day for 1 year.  Yearly screening should continue until it has been 15 years since you quit.  Yearly screening should stop if you develop a health problem that would prevent you from having lung cancer treatment. Breast Cancer  Practice breast self-awareness. This means understanding how your breasts normally appear and feel.  It also means doing regular breast self-exams. Let your health care provider know about any changes, no matter how small.  If you are in your 20s or 30s, you should have a clinical breast exam (CBE) by a health care provider every 1-3 years as part of a regular health exam.  If you are 40 or older,  have a CBE every year. Also consider having a breast X-ray (mammogram) every year.  If you have a family history of breast cancer, talk to your health care provider about genetic screening.  If you are at high risk for breast cancer, talk to your health care provider about having an MRI and a mammogram every year.  Breast cancer gene (BRCA) assessment is recommended for women who have family members with BRCA-related cancers. BRCA-related cancers  include: ? Breast. ? Ovarian. ? Tubal. ? Peritoneal cancers.  Results of the assessment will determine the need for genetic counseling and BRCA1 and BRCA2 testing. Cervical Cancer Your health care provider may recommend that you be screened regularly for cancer of the pelvic organs (ovaries, uterus, and vagina). This screening involves a pelvic examination, including checking for microscopic changes to the surface of your cervix (Pap test). You may be encouraged to have this screening done every 3 years, beginning at age 17.  For women ages 58-65, health care providers may recommend pelvic exams and Pap testing every 3 years, or they may recommend the Pap and pelvic exam, combined with testing for human papilloma virus (HPV), every 5 years. Some types of HPV increase your risk of cervical cancer. Testing for HPV may also be done on women of any age with unclear Pap test results.  Other health care providers may not recommend any screening for nonpregnant women who are considered low risk for pelvic cancer and who do not have symptoms. Ask your health care provider if a screening pelvic exam is right for you.  If you have had past treatment for cervical cancer or a condition that could lead to cancer, you need Pap tests and screening for cancer for at least 20 years after your treatment. If Pap tests have been discontinued, your risk factors (such as having a new sexual partner) need to be reassessed to determine if screening should resume. Some women have medical problems that increase the chance of getting cervical cancer. In these cases, your health care provider may recommend more frequent screening and Pap tests. Colorectal Cancer  This type of cancer can be detected and often prevented.  Routine colorectal cancer screening usually begins at 72 years of age and continues through 72 years of age.  Your health care provider may recommend screening at an earlier age if you have risk factors for  colon cancer.  Your health care provider may also recommend using home test kits to check for hidden blood in the stool.  A small camera at the end of a tube can be used to examine your colon directly (sigmoidoscopy or colonoscopy). This is done to check for the earliest forms of colorectal cancer.  Routine screening usually begins at age 33.  Direct examination of the colon should be repeated every 5-10 years through 72 years of age. However, you may need to be screened more often if early forms of precancerous polyps or small growths are found. Skin Cancer  Check your skin from head to toe regularly.  Tell your health care provider about any new moles or changes in moles, especially if there is a change in a mole's shape or color.  Also tell your health care provider if you have a mole that is larger than the size of a pencil eraser.  Always use sunscreen. Apply sunscreen liberally and repeatedly throughout the day.  Protect yourself by wearing long sleeves, pants, a wide-brimmed hat, and sunglasses whenever you are outside. Heart disease, diabetes, and  high blood pressure  High blood pressure causes heart disease and increases the risk of stroke. High blood pressure is more likely to develop in: ? People who have blood pressure in the high end of the normal range (130-139/85-89 mm Hg). ? People who are overweight or obese. ? People who are African American.  If you are 13-52 years of age, have your blood pressure checked every 3-5 years. If you are 14 years of age or older, have your blood pressure checked every year. You should have your blood pressure measured twice-once when you are at a hospital or clinic, and once when you are not at a hospital or clinic. Record the average of the two measurements. To check your blood pressure when you are not at a hospital or clinic, you can use: ? An automated blood pressure machine at a pharmacy. ? A home blood pressure monitor.  If you are  between 67 years and 81 years old, ask your health care provider if you should take aspirin to prevent strokes.  Have regular diabetes screenings. This involves taking a blood sample to check your fasting blood sugar level. ? If you are at a normal weight and have a low risk for diabetes, have this test once every three years after 72 years of age. ? If you are overweight and have a high risk for diabetes, consider being tested at a younger age or more often. Preventing infection Hepatitis B  If you have a higher risk for hepatitis B, you should be screened for this virus. You are considered at high risk for hepatitis B if: ? You were born in a country where hepatitis B is common. Ask your health care provider which countries are considered high risk. ? Your parents were born in a high-risk country, and you have not been immunized against hepatitis B (hepatitis B vaccine). ? You have HIV or AIDS. ? You use needles to inject street drugs. ? You live with someone who has hepatitis B. ? You have had sex with someone who has hepatitis B. ? You get hemodialysis treatment. ? You take certain medicines for conditions, including cancer, organ transplantation, and autoimmune conditions. Hepatitis C  Blood testing is recommended for: ? Everyone born from [birth year] through 1964. ? Anyone with known risk factors for hepatitis C. Sexually transmitted infections (STIs)  You should be screened for sexually transmitted infections (STIs) including gonorrhea and chlamydia if: ? You are sexually active and are younger than 72 years of age. ? You are older than 72 years of age and your health care provider tells you that you are at risk for this type of infection. ? Your sexual activity has changed since you were last screened and you are at an increased risk for chlamydia or gonorrhea. Ask your health care provider if you are at risk.  If you do not have HIV, but are at risk, it may be recommended that you take  a prescription medicine daily to prevent HIV infection. This is called pre-exposure prophylaxis (PrEP). You are considered at risk if: ? You are sexually active and do not regularly use condoms or know the HIV status of your partner(s). ? You take drugs by injection. ? You are sexually active with a partner who has HIV. Talk with your health care provider about whether you are at high risk of being infected with HIV. If you choose to begin PrEP, you should first be tested for HIV. You should then be tested every 3  months for as long as you are taking PrEP. Pregnancy  If you are premenopausal and you may become pregnant, ask your health care provider about preconception counseling.  If you may become pregnant, take 400 to 800 micrograms (mcg) of folic acid every day.  If you want to prevent pregnancy, talk to your health care provider about birth control (contraception). Osteoporosis and menopause  Osteoporosis is a disease in which the bones lose minerals and strength with aging. This can result in serious bone fractures. Your risk for osteoporosis can be identified using a bone density scan.  If you are 52 years of age or older, or if you are at risk for osteoporosis and fractures, ask your health care provider if you should be screened.  Ask your health care provider whether you should take a calcium or vitamin D supplement to lower your risk for osteoporosis.  Menopause may have certain physical symptoms and risks.  Hormone replacement therapy may reduce some of these symptoms and risks. Talk to your health care provider about whether hormone replacement therapy is right for you. Follow these instructions at home:  Schedule regular health, dental, and eye exams.  Stay current with your immunizations.  Do not use any tobacco products including cigarettes, chewing tobacco, or electronic cigarettes.  If you are pregnant, do not drink alcohol.  If you are breastfeeding, limit how  much and how often you drink alcohol.  Limit alcohol intake to no more than 1 drink per day for nonpregnant women. One drink equals 12 ounces of beer, 5 ounces of wine, or 1 ounces of hard liquor.  Do not use street drugs.  Do not share needles.  Ask your health care provider for help if you need support or information about quitting drugs.  Tell your health care provider if you often feel depressed.  Tell your health care provider if you have ever been abused or do not feel safe at home. This information is not intended to replace advice given to you by your health care provider. Make sure you discuss any questions you have with your health care provider. Document Released: 02/22/2011 Document Revised: 01/15/2016 Document Reviewed: 05/13/2015 Elsevier Interactive Patient Education  2019 [employer].

## 2018-09-28 NOTE — Progress Notes (Signed)
I have reviewed the documentation from the recent AWV done by Kim Broome; I agree with the documentation and will follow up on any recommendations or abnormal findings as suggested.  

## 2018-10-30 ENCOUNTER — Other Ambulatory Visit: Payer: Self-pay | Admitting: Family Medicine

## 2019-01-11 ENCOUNTER — Other Ambulatory Visit: Payer: Self-pay | Admitting: Family Medicine

## 2019-01-16 ENCOUNTER — Encounter: Payer: Medicare Other | Admitting: Family Medicine

## 2019-01-24 ENCOUNTER — Encounter: Payer: Self-pay | Admitting: Family Medicine

## 2019-01-24 ENCOUNTER — Other Ambulatory Visit: Payer: Self-pay

## 2019-01-24 ENCOUNTER — Ambulatory Visit (INDEPENDENT_AMBULATORY_CARE_PROVIDER_SITE_OTHER): Payer: Medicare Other | Admitting: Family Medicine

## 2019-01-24 VITALS — BP 135/81 | HR 69 | Temp 98.0°F | Ht 65.0 in | Wt 139.0 lb

## 2019-01-24 DIAGNOSIS — I10 Essential (primary) hypertension: Secondary | ICD-10-CM | POA: Diagnosis not present

## 2019-01-24 DIAGNOSIS — M810 Age-related osteoporosis without current pathological fracture: Secondary | ICD-10-CM

## 2019-01-24 DIAGNOSIS — E782 Mixed hyperlipidemia: Secondary | ICD-10-CM | POA: Diagnosis not present

## 2019-01-24 DIAGNOSIS — E039 Hypothyroidism, unspecified: Secondary | ICD-10-CM

## 2019-01-24 DIAGNOSIS — Z2821 Immunization not carried out because of patient refusal: Secondary | ICD-10-CM

## 2019-01-24 LAB — COMPREHENSIVE METABOLIC PANEL
ALT: 12 U/L (ref 0–35)
AST: 15 U/L (ref 0–37)
Albumin: 4.1 g/dL (ref 3.5–5.2)
Alkaline Phosphatase: 42 U/L (ref 39–117)
BUN: 21 mg/dL (ref 6–23)
CO2: 31 mEq/L (ref 19–32)
Calcium: 9.4 mg/dL (ref 8.4–10.5)
Chloride: 102 mEq/L (ref 96–112)
Creatinine, Ser: 0.7 mg/dL (ref 0.40–1.20)
GFR: 82.21 mL/min (ref >60.00)
Glucose, Bld: 92 mg/dL (ref 70–99)
Potassium: 4.1 mEq/L (ref 3.5–5.1)
Sodium: 141 mEq/L (ref 135–145)
Total Bilirubin: 0.5 mg/dL (ref 0.2–1.2)
Total Protein: 6.5 g/dL (ref 6.0–8.3)

## 2019-01-24 LAB — CBC WITH DIFFERENTIAL/PLATELET
Basophils Absolute: 0 10*3/uL (ref 0.0–0.1)
Basophils Relative: 0.7 % (ref 0.0–3.0)
Eosinophils Absolute: 0.3 10*3/uL (ref 0.0–0.7)
Eosinophils Relative: 4.2 % (ref 0.0–5.0)
HCT: 39.5 % (ref 36.0–46.0)
Hemoglobin: 13.4 g/dL (ref 12.0–15.0)
Lymphocytes Relative: 21.4 % (ref 12.0–46.0)
Lymphs Abs: 1.3 10*3/uL (ref 0.7–4.0)
MCHC: 33.9 g/dL (ref 30.0–36.0)
MCV: 86.7 fl (ref 78.0–100.0)
Monocytes Absolute: 0.3 10*3/uL (ref 0.1–1.0)
Monocytes Relative: 5.6 % (ref 3.0–12.0)
Neutro Abs: 4.2 10*3/uL (ref 1.4–7.7)
Neutrophils Relative %: 68.1 % (ref 43.0–77.0)
Platelets: 193 10*3/uL (ref 150.0–400.0)
RBC: 4.56 Mil/uL (ref 3.87–5.11)
RDW: 13.4 % (ref 11.5–15.5)
WBC: 6.2 10*3/uL (ref 4.0–10.5)

## 2019-01-24 LAB — LIPID PANEL
Cholesterol: 144 mg/dL (ref 0–200)
HDL: 60.4 mg/dL (ref >39.00)
LDL Cholesterol: 68 mg/dL (ref 0–99)
NonHDL: 83.26
Total CHOL/HDL Ratio: 2
Triglycerides: 75 mg/dL (ref 0.0–149.0)
VLDL: 15 mg/dL (ref 0.0–40.0)

## 2019-01-24 LAB — TSH: TSH: 1.72 u[IU]/mL (ref 0.35–4.50)

## 2019-01-24 NOTE — Patient Instructions (Signed)
Please return in 6 months for follow up of your hypertension.  I will renew your medications - most are due around august 1st. Please send a renewal request then to me and I'll renew for the year.   If you have any questions or concerns, please don't hesitate to send me a message via MyChart or call the office at [phone]. Thank you for visiting with [place] today! It's our pleasure caring for you.  Take care! Always good to see you.

## 2019-01-24 NOTE — Progress Notes (Signed)
Subjective  Chief Complaint  Patient presents with  . Annual Exam    HPI: Kristi Jones is a 73 y.o. female who presents to [facility] today for a Female Wellness Visit. She also has the concerns and/or needs as listed above in the chief complaint. These will be addressed in addition to the Health Maintenance Visit.   Wellness Visit: annual visit with health maintenance review and exam without Pap   Medicare wellness: declines pneuomococcl and shingles vaccinations. mammo scheduled for July. Dexa and colonoscopy are up to date Chronic disease f/u and/or acute problem visit: (deemed necessary to be done in addition to the wellness visit):  HTN: has been doing well. No concerns. Feels great. Feeling well. Taking medications w/o adverse effects. No symptoms of CHF, angina; no palpitations, sob, cp or lower extremity edema. Compliant with meds.   HLD onstatin due for recheck. Needs refills  Hypothyroidism: no sxs of high or low and takes meds as directed. Due for recheck.   Assessment  1. HTN (hypertension), benign   2. Mixed hyperlipidemia   3. Acquired hypothyroidism   4. Age-related osteoporosis without current pathological fracture   5. Pneumococcal vaccine refused      Plan  Female Wellness Visit:  Age appropriate Health Maintenance and Prevention measures were discussed with patient. Included topics are cancer screening recommendations, ways to keep healthy (see AVS) including dietary and exercise recommendations, regular eye and dental care, use of seat belts, and avoidance of moderate alcohol use and tobacco use.   BMI: discussed patient's BMI and encouraged positive lifestyle modifications to help get to or maintain a target BMI.  HM needs and immunizations were addressed and ordered. See below for orders. See HM and immunization section for updates.educated and pt declines.   Routine labs and screening tests ordered including cmp, cbc and  lipids where appropriate.  Discussed recommendations regarding Vit D and calcium supplementation (see AVS)  Chronic disease management visit and/or acute problem visit:  HTN: fair control. Pt to check home readings and remains elevated, will adjust up medications. She will mychart me.   HLD on statin for recheck. Fasting . Stable lfts  Recheck thyroid. clinically euthyroid.   Continue healthy lifestyle.  Follow up: Return in about 6 months (around 07/26/2019) for follow up Hypertension.  Orders Placed This Encounter  Procedures  . CBC with Differential/Platelet  . Comprehensive metabolic panel  . Lipid panel  . TSH   No orders of the defined types were placed in this encounter.     Lifestyle: Body mass index is 23.13 kg/m. Wt Readings from Last 3 Encounters:  01/24/19 139 lb (63 kg)  09/28/18 139 lb (63 kg)  01/10/18 140 lb 6.4 oz (63.7 kg)   Diet: low fat Exercise: frequently,   Patient Active Problem List   Diagnosis Date Noted  . Hyperlipidemia 05/10/2014    Priority: High    Failed simvastatin due to myalgias   . HTN (hypertension), benign 10/31/2013    Priority: High  . Hypothyroidism 10/31/2013    Priority: High  . Breast fibroadenoma, left 11/08/2014    Priority: Medium    Left subareolar.  Status post biopsy   . Age-related osteoporosis without current pathological fracture 10/31/2013    Priority: Medium    DEXA 01/2013 - T - -2.3 with elevated FRAX of 20.9%; rec consideration of fosamax - pt declines now 04/2014/cla DEXA 11/2014 T= - 2.5 with elevated FRZS of 23% and elevated hip  frac 4% - Fosamax started/cla DEXA 01/2017: T=-2.2, lowest; 4% improved in femur, 11% improvement in spine on fosamax/ 12/2017   . Sebaceous cyst, upper back 06/15/2012    Priority: Low  . Pneumococcal vaccine refused 01/24/2019   Health Maintenance  Topic Date Due  . PNA vac Low Risk Adult (1 of 2 - PCV13) 11/09/2011  . MAMMOGRAM  02/17/2019  . INFLUENZA VACCINE   03/24/2019  . DEXA SCAN  05/18/2020  . TETANUS/TDAP  11/08/2020  . COLONOSCOPY  06/16/2022  . Hepatitis C Screening  Completed   Immunization History  Administered Date(s) Administered  . Influenza, High Dose Seasonal PF 06/04/2015  . Tdap 11/09/2010   We updated and reviewed the patient's past history in detail and it is documented below. Allergies: Patient is allergic to sulfa antibiotics; sulfa drugs cross reactors; simvastatin; penicillins; and lisinopril. Past Medical History Patient  has a past medical history of Hyperlipemia, Hypertension, Osteopenia (10/31/2013), and Thyroid disease. Past Surgical History Patient  has a past surgical history that includes I&D extremity (01/19/2012); Appendectomy; Hand surgery (2013); Vaginal hysterectomy; and Tonsillectomy and adenoidectomy. Family History: Patient family history includes Alzheimer's disease in her mother; Breast cancer in her sister; Diabetes in her mother; Heart disease in her father and mother; Hyperlipidemia in her brother; Hypertension in her mother. Social History:  Patient  reports that she has never smoked. She has never used smokeless tobacco. She reports current alcohol use. She reports that she does not use drugs.  Review of Systems: Constitutional: negative for fever or malaise Ophthalmic: negative for photophobia, double vision or loss of vision Cardiovascular: negative for chest pain, dyspnea on exertion, or new LE swelling Respiratory: negative for SOB or persistent cough Gastrointestinal: negative for abdominal pain, change in bowel habits or melena Genitourinary: negative for dysuria or gross hematuria, no abnormal uterine bleeding or disharge Musculoskeletal: negative for new gait disturbance or muscular weakness Integumentary: negative for new or persistent rashes, no breast lumps Neurological: negative for TIA or stroke symptoms Psychiatric: negative for SI or delusions Allergic/Immunologic: negative for  hives  Patient Care Team    Relationship Specialty Notifications Start End  Leamon Arnt, MD PCP - General Family Medicine  01/10/18   Dermatology, [facility]    09/28/18    BP Readings from Last 3 Encounters:  01/24/19 (!) 141/79  09/28/18 134/70  01/10/18 122/78    Objective  Vitals: BP (!) 141/79 (BP Location: Right Arm, Patient Position: Sitting, Cuff Size: Normal)   Pulse 69   Temp 98 F (36.7 C) (Oral)   Ht 5\' 5"  (1.651 m)   Wt 139 lb (63 kg)   SpO2 97%   BMI 23.13 kg/m  General:  Well developed, well nourished, no acute distress  Psych:  Alert and orientedx3,normal mood and affect HEENT:  Normocephalic, atraumatic, non-icteric sclera, PERRL, oropharynx is clear without mass or exudate, supple neck without adenopathy, mass or thyromegaly Cardiovascular:  Normal S1, S2, RRR without gallop, rub or murmur, nondisplaced PMI Respiratory:  Good breath sounds bilaterally, CTAB with normal respiratory effort Gastrointestinal: normal bowel sounds, soft, non-tender, no noted masses. No HSM MSK: no deformities, contusions. Joints are without erythema or swelling. Spine and CVA region are nontender Skin:  Warm, no rashes or suspicious lesions noted Neurologic:    Mental status is normal. CN 2-11 are normal. Gross motor and sensory exams are normal. Normal gait. No tremor    Commons side effects, risks, benefits, and alternatives for medications and treatment plan prescribed today  were discussed, and the patient expressed understanding of the given instructions. Patient is instructed to call or message via MyChart if he/she has any questions or concerns regarding our treatment plan. No barriers to understanding were identified. We discussed Red Flag symptoms and signs in detail. Patient expressed understanding regarding what to do in case of urgent or emergency type symptoms.   Medication list was reconciled, printed and provided to the patient in AVS. Patient instructions and summary  information was reviewed with the patient as documented in the AVS. This note was prepared with assistance of Dragon voice recognition software. Occasional wrong-word or sound-a-like substitutions may have occurred due to the inherent limitations of voice recognition software

## 2019-02-01 ENCOUNTER — Other Ambulatory Visit: Payer: Self-pay | Admitting: Family Medicine

## 2019-02-21 DIAGNOSIS — Z1231 Encounter for screening mammogram for malignant neoplasm of breast: Secondary | ICD-10-CM | POA: Diagnosis not present

## 2019-02-21 DIAGNOSIS — M8589 Other specified disorders of bone density and structure, multiple sites: Secondary | ICD-10-CM | POA: Diagnosis not present

## 2019-02-21 DIAGNOSIS — Z90710 Acquired absence of both cervix and uterus: Secondary | ICD-10-CM | POA: Diagnosis not present

## 2019-02-21 DIAGNOSIS — R29890 Loss of height: Secondary | ICD-10-CM | POA: Diagnosis not present

## 2019-02-21 DIAGNOSIS — Z803 Family history of malignant neoplasm of breast: Secondary | ICD-10-CM | POA: Diagnosis not present

## 2019-02-21 DIAGNOSIS — Z8262 Family history of osteoporosis: Secondary | ICD-10-CM | POA: Diagnosis not present

## 2019-02-21 LAB — HM DEXA SCAN

## 2019-02-21 LAB — HM MAMMOGRAPHY

## 2019-03-01 ENCOUNTER — Encounter: Payer: Self-pay | Admitting: Family Medicine

## 2019-03-30 ENCOUNTER — Other Ambulatory Visit: Payer: Self-pay | Admitting: Family Medicine

## 2019-04-09 ENCOUNTER — Other Ambulatory Visit: Payer: Self-pay | Admitting: Family Medicine

## 2019-07-26 ENCOUNTER — Encounter: Payer: Self-pay | Admitting: Family Medicine

## 2019-07-26 NOTE — Telephone Encounter (Signed)
Patient wants to do virtual only. Please advise as this is for a follow up for hypertension.

## 2019-07-27 ENCOUNTER — Ambulatory Visit (INDEPENDENT_AMBULATORY_CARE_PROVIDER_SITE_OTHER): Payer: Medicare Other | Admitting: Family Medicine

## 2019-07-27 ENCOUNTER — Encounter: Payer: Self-pay | Admitting: Family Medicine

## 2019-07-27 VITALS — BP 120/80

## 2019-07-27 DIAGNOSIS — E782 Mixed hyperlipidemia: Secondary | ICD-10-CM | POA: Diagnosis not present

## 2019-07-27 DIAGNOSIS — I10 Essential (primary) hypertension: Secondary | ICD-10-CM

## 2019-07-27 DIAGNOSIS — E039 Hypothyroidism, unspecified: Secondary | ICD-10-CM | POA: Diagnosis not present

## 2019-07-27 DIAGNOSIS — Z2821 Immunization not carried out because of patient refusal: Secondary | ICD-10-CM

## 2019-07-27 MED ORDER — LOSARTAN POTASSIUM 50 MG PO TABS: 50.0000 mg | ORAL_TABLET | Freq: Every day | ORAL | 3 refills | Status: DC

## 2019-07-27 MED ORDER — HYDROCHLOROTHIAZIDE 25 MG PO TABS: 25.0000 mg | ORAL_TABLET | Freq: Every morning | ORAL | 3 refills | Status: DC

## 2019-07-27 MED ORDER — ROSUVASTATIN CALCIUM 5 MG PO TABS: 5.0000 mg | ORAL_TABLET | Freq: Every day | ORAL | 3 refills | Status: DC

## 2019-07-27 MED ORDER — LEVOTHYROXINE SODIUM 88 MCG PO TABS: 88.0000 ug | ORAL_TABLET | Freq: Every day | ORAL | 3 refills | Status: DC

## 2019-07-27 NOTE — Progress Notes (Signed)
Virtual Visit via Video Note  Subjective  CC:  Chief Complaint  Patient presents with  . Hypertension     I connected with Kristi Jones on 07/27/19 at  9:20 AM EST by a video enabled telemedicine application and verified that I am speaking with the correct person using two identifiers. Location patient: Home Location provider: [facility] [place] participating in the virtual visit: Kristi Jones, Kristi Arnt, MD Amber Agner, CMA  I discussed the limitations of evaluation and management by telemedicine and the availability of in person appointments. The patient expressed understanding and agreed to proceed. HPI: Kristi Jones is a 73 y.o. female who was contacted today to address the problems listed above in the chief complaint/HTN f/u:  . Hypertension f/u: Control is good . Pt reports she is doing well. taking medications as instructed, no medication side effects noted, no TIAs, no chest pain on exertion, no dyspnea on exertion, no swelling of ankles. Home readings are consistently normal. No concerns.  She denies adverse effects from his BP medications. Compliance with medication is good.  . Imms: had flu and wants covid vaccine. Defers prevnar for now.  Marland Kitchen HLD and low thyroid: feels well. Tolerating statin. No sxs of high or low thyroid. Weight is stable. Needs refills.   BP Readings from Last 3 Encounters:  07/27/19 120/80  01/24/19 135/81  09/28/18 134/70   Wt Readings from Last 3 Encounters:  01/24/19 139 lb (63 kg)  09/28/18 139 lb (63 kg)  01/10/18 140 lb 6.4 oz (63.7 kg)    Lab Results  Component Value Date   CHOL 144 01/24/2019   CHOL 158 01/10/2018   Lab Results  Component Value Date   HDL 60.40 01/24/2019   HDL 64.00 01/10/2018   Lab Results  Component Value Date   LDLCALC 68 01/24/2019   [place] 76 01/10/2018   Lab Results  Component Value Date   TRIG 75.0 01/24/2019   TRIG 89.0 01/10/2018   Lab  Results  Component Value Date   CHOLHDL 2 01/24/2019   CHOLHDL 2 01/10/2018   No results found for: LDLDIRECT Lab Results  Component Value Date   CREATININE 0.70 01/24/2019   BUN 21 01/24/2019   NA 141 01/24/2019   K 4.1 01/24/2019   CL 102 01/24/2019   CO2 31 01/24/2019    The 10-year ASCVD risk score Mikey Bussing DC Jr., et al., 2013) is: 12.7%   Values used to calculate the score:     Age: 71 years     Sex: Female     Is Non-Hispanic African American: No     Diabetic: No     Tobacco smoker: No     Systolic Blood Pressure: 123456 mmHg     Is BP treated: Yes     HDL Cholesterol: 60.4 mg/dL     Total Cholesterol: 144 mg/dL  Assessment  No diagnosis found.   Plan   Hypertension f/u:  This medical condition is well controlled. There are no signs of complications, medication side effects, or red flags. Patient is instructed to continue the current treatment plan without change in therapies or medications.   HLD: This medical condition is well controlled. There are no signs of complications, medication side effects, or red flags. Patient is instructed to continue the current treatment plan without change in therapies or medications.   Low thyroid: This medical condition is well controlled. There are no signs of  complications, medication side effects, or red flags. Patient is instructed to continue the current treatment plan without change in therapies or medications.   Refilled all meds.   rec covid vaccine once availabel.   F/u 6 months for cpe. Has awv scheduled for February; would prefer virtual.   I discussed the assessment and treatment plan with the patient. The patient was provided an opportunity to ask questions and all were answered. The patient agreed with the plan and demonstrated an understanding of the instructions.   The patient was advised to call back or seek an in-person evaluation if the symptoms worsen or if the condition fails to improve as anticipated. Follow  up: 6 months cpe  Visit date not found  No orders of the defined types were placed in this encounter.     I reviewed the patients updated PMH, FH, and SocHx.    Patient Active Problem List   Diagnosis Date Noted  . Hyperlipidemia 05/10/2014    Priority: High  . HTN (hypertension), benign 10/31/2013    Priority: High  . Hypothyroidism 10/31/2013    Priority: High  . Breast fibroadenoma, left 11/08/2014    Priority: Medium  . Age-related osteoporosis without current pathological fracture 10/31/2013    Priority: Medium  . Sebaceous cyst, upper back 06/15/2012    Priority: Low  . Pneumococcal vaccine refused 01/24/2019   Current Meds  Medication Sig  . alendronate (FOSAMAX) 70 MG tablet TAKE 1 TAB ONCE A WEEK, AT LEAST 30 MIN BEFORE 1ST FOOD.DO NOT LIE DOWN FOR 30 MIN AFTER TAKING.  Marland Kitchen aspirin EC 81 MG tablet Take 81 mg by mouth daily.  . folic acid (FOLVITE) 1 MG tablet TAKE 1 TABLET ONCE DAILY.  . hydrochlorothiazide (HYDRODIURIL) 25 MG tablet TAKE 1 TABLET IN THE MORNING.  Marland Kitchen levothyroxine (SYNTHROID) 88 MCG tablet TAKE 1 TABLET ONCE DAILY.  Marland Kitchen losartan (COZAAR) 50 MG tablet TAKE 1 TABLET EACH DAY.  . rosuvastatin (CRESTOR) 5 MG tablet TAKE ONE TABLET AT BEDTIME.    Allergies: Patient is allergic to sulfa antibiotics; sulfa drugs cross reactors; simvastatin; penicillins; and lisinopril. Family History: Patient family history includes Alzheimer's disease in her mother; Breast cancer in her sister; Diabetes in her mother; Heart disease in her father and mother; Hyperlipidemia in her brother; Hypertension in her mother. Social History:  Patient  reports that she has never smoked. She has never used smokeless tobacco. She reports current alcohol use. She reports that she does not use drugs.  Review of Systems: Constitutional: Negative for fever malaise or anorexia Cardiovascular: negative for chest pain Respiratory: negative for SOB or persistent cough Gastrointestinal:  negative for abdominal pain  OBJECTIVE Vitals: BP 120/80  General: no acute distress , A&Ox3  Kristi Arnt, MD

## 2019-08-02 ENCOUNTER — Other Ambulatory Visit: Payer: Self-pay | Admitting: Family Medicine

## 2019-09-06 DIAGNOSIS — Z23 Encounter for immunization: Secondary | ICD-10-CM | POA: Diagnosis not present

## 2019-09-07 ENCOUNTER — Encounter: Payer: Self-pay | Admitting: Family Medicine

## 2019-09-11 ENCOUNTER — Encounter: Payer: Self-pay | Admitting: Family Medicine

## 2019-09-11 ENCOUNTER — Ambulatory Visit (INDEPENDENT_AMBULATORY_CARE_PROVIDER_SITE_OTHER): Payer: Medicare Other | Admitting: Family Medicine

## 2019-09-11 VITALS — Ht 65.0 in | Wt 139.0 lb

## 2019-09-11 DIAGNOSIS — L608 Other nail disorders: Secondary | ICD-10-CM

## 2019-09-11 MED ORDER — TERBINAFINE HCL 250 MG PO TABS: 250.0000 mg | ORAL_TABLET | Freq: Every day | ORAL | 1 refills | Status: DC

## 2019-09-11 NOTE — Progress Notes (Signed)
Virtual Visit via Video Note  Subjective  CC:  Chief Complaint  Patient presents with  . Nail Problem    right foot and left foot. Started this summer     I connected with [name] on 09/11/19 at  3:40 PM EST by a video enabled telemedicine application and verified that I am speaking with the correct person using two identifiers. Location patient: Home Location provider: [facility] [place], Office Persons participating in the virtual visit: SEAN DEFREECE, Leamon Arnt, MD Serita Sheller, [place] discussed the limitations of evaluation and management by telemedicine and the availability of in person appointments. The patient expressed understanding and agreed to proceed. HPI: Kristi Jones is a 74 y.o. female who was contacted today to address the problems listed above in the chief complaint. . 74 year old reports problems with nails.  See a recent MyChart visit that has uploaded pictures of her toenails and one thumbnail.  She reports last summer she noted some changes in the skin of her feet and then noticed some thickening of one of her toenails.  Over the course of the next several months all nails became infected.  They are thick and misshapened and discolored.  They are not painful.  However over the last several weeks she noticed some ridging in the thumbnail and it seems to be coming up at the base of the nail.  She is not sure if she has sustained some trauma but she thinks she might have.  No thickening of her fingernails.  No skin rashes.  She has a history of onychomycosis in the past and has used Lamisil without problems.  She has never had any liver problems. Assessment  1. Onychomadesis of toenail   2. Spontaneous shedding of nail from nail matrix      Plan   Onychomycosis toenails: Educated: Start Lamisil and treat for 1 to 3 months.  Follow-up if unimproved  Suspect  trauma to the base of the thumbnail.  It does not look  fungal.  Reassured.  Will monitor. I discussed the assessment and treatment plan with the patient. The patient was provided an opportunity to ask questions and all were answered. The patient agreed with the plan and demonstrated an understanding of the instructions.   The patient was advised to call back or seek an in-person evaluation if the symptoms worsen or if the condition fails to improve as anticipated. Follow up: Return in about 5 months (around 02/09/2020) for complete physical.  10/05/2019  Meds ordered this encounter  Medications  . terbinafine (LAMISIL) 250 MG tablet    Sig: Take 1 tablet (250 mg total) by mouth daily.    Dispense:  90 tablet    Refill:  1      I reviewed the patients updated PMH, FH, and SocHx.    Patient Active Problem List   Diagnosis Date Noted  . Hyperlipidemia 05/10/2014    Priority: High  . HTN (hypertension), benign 10/31/2013    Priority: High  . Hypothyroidism 10/31/2013    Priority: High  . Breast fibroadenoma, left 11/08/2014    Priority: Medium  . Age-related osteoporosis without current pathological fracture 10/31/2013    Priority: Medium  . Sebaceous cyst, upper back 06/15/2012    Priority: Low  . Pneumococcal vaccine refused 01/24/2019   Current Meds  Medication Sig  . alendronate (FOSAMAX) 70 MG tablet TAKE 1 TAB ONCE A WEEK, AT LEAST 30 MIN  BEFORE 1ST FOOD.DO NOT LIE DOWN FOR 30 MIN AFTER TAKING.  Marland Kitchen aspirin EC 81 MG tablet Take 81 mg by mouth daily.  . folic acid (FOLVITE) 1 MG tablet TAKE 1 TABLET ONCE DAILY.  . hydrochlorothiazide (HYDRODIURIL) 25 MG tablet Take 1 tablet (25 mg total) by mouth every morning.  Marland Kitchen levothyroxine (SYNTHROID) 88 MCG tablet Take 1 tablet (88 mcg total) by mouth daily.  Marland Kitchen losartan (COZAAR) 50 MG tablet Take 1 tablet (50 mg total) by mouth daily.  . rosuvastatin (CRESTOR) 5 MG tablet Take 1 tablet (5 mg total) by mouth at bedtime.    Allergies: Patient is allergic to sulfa antibiotics; sulfa drugs  cross reactors; simvastatin; penicillins; and lisinopril. Family History: Patient family history includes Alzheimer's disease in her mother; Breast cancer in her sister; Diabetes in her mother; Heart disease in her father and mother; Hyperlipidemia in her brother; Hypertension in her mother. Social History:  Patient  reports that she has never smoked. She has never used smokeless tobacco. She reports current alcohol use. She reports that she does not use drugs.  Review of Systems: Constitutional: Negative for fever malaise or anorexia Cardiovascular: negative for chest pain Respiratory: negative for SOB or persistent cough Gastrointestinal: negative for abdominal pain  OBJECTIVE Vitals: Ht 5\' 5"  (1.651 m)   Wt 139 lb (63 kg)   BMI 23.13 kg/m  General: no acute distress , A&Ox3 Reviewed uploaded pics from mychart note.  Leamon Arnt, MD

## 2019-09-11 NOTE — Patient Instructions (Signed)
Please return in 6 months for your annual complete physical; please come fasting.   If you have any questions or concerns, please don't hesitate to send me a message via MyChart or call the office at [phone]. Thank you for visiting with us today! It's our pleasure caring for you.  

## 2019-10-03 ENCOUNTER — Ambulatory Visit: Payer: Medicare Other

## 2019-10-04 DIAGNOSIS — Z23 Encounter for immunization: Secondary | ICD-10-CM | POA: Diagnosis not present

## 2019-10-05 ENCOUNTER — Ambulatory Visit (INDEPENDENT_AMBULATORY_CARE_PROVIDER_SITE_OTHER): Payer: Medicare Other

## 2019-10-05 ENCOUNTER — Other Ambulatory Visit: Payer: Self-pay

## 2019-10-05 DIAGNOSIS — Z0000 Encounter for general adult medical examination without abnormal findings: Secondary | ICD-10-CM | POA: Diagnosis not present

## 2019-10-05 NOTE — Patient Instructions (Signed)
Kristi Jones , Thank you for taking time to come for your Medicare Wellness Visit. I appreciate your ongoing commitment to your health goals. Please review the following plan we discussed and let me know if I can assist you in the future.   Screening recommendations/referrals: Colorectal Screening: up to date; last colonoscopy 06/16/12 (repeat in 2023) Mammogram: up to date; last 02/21/19 Bone Density: up to date; last 02/21/19  Vision and Dental Exams: Recommended annual ophthalmology exams for early detection of glaucoma and other disorders of the eye Recommended annual dental exams for proper oral hygiene  Vaccinations: Influenza vaccine: completed 04/24/19 Pneumococcal vaccine: recommended starting with Prevnar  Tdap vaccine: up to date; last 11/09/10  Shingles vaccine: Please call your insurance company to determine your out of pocket expense for the Shingrix vaccine. You may receive this vaccine at your local pharmacy.  Advanced directives: We have received a copy of your POA (Power of [place]) and/or Living Will. These documents can be located in your chart.  Goals: Recommend to drink at least 6-8 8oz glasses of water per day and consume a balanced diet rich in fresh fruits and vegetables.   Next appointment: Please schedule your Annual Wellness Visit with your Nurse Health Advisor in one year.  Preventive Care 17 Years and Older, Female Preventive care refers to lifestyle choices and visits with your health care provider that can promote health and wellness. What does preventive care include?  A yearly physical exam. This is also called an annual well check.  Dental exams once or twice a year.  Routine eye exams. Ask your health care provider how often you should have your eyes checked.  Personal lifestyle choices, including:  Daily care of your teeth and gums.  Regular physical activity.  Eating a healthy diet.  Avoiding tobacco and drug use.  Limiting alcohol  use.  Practicing safe sex.  Taking low-dose aspirin every day if recommended by your health care provider.  Taking vitamin and mineral supplements as recommended by your health care provider. What happens during an annual well check? The services and screenings done by your health care provider during your annual well check will depend on your age, overall health, lifestyle risk factors, and family history of disease. Counseling  Your health care provider may ask you questions about your:  Alcohol use.  Tobacco use.  Drug use.  Emotional well-being.  Home and relationship well-being.  Sexual activity.  Eating habits.  History of falls.  Memory and ability to understand (cognition).  Work and work Statistician.  Reproductive health. Screening  You may have the following tests or measurements:  Height, weight, and BMI.  Blood pressure.  Lipid and cholesterol levels. These may be checked every 5 years, or more frequently if you are over 43 years old.  Skin check.  Lung cancer screening. You may have this screening every year starting at age 59 if you have a 30-pack-year history of smoking and currently smoke or have quit within the past 15 years.  Fecal occult blood test (FOBT) of the stool. You may have this test every year starting at age 52.  Flexible sigmoidoscopy or colonoscopy. You may have a sigmoidoscopy every 5 years or a colonoscopy every 10 years starting at age 28.  Hepatitis C blood test.  Hepatitis B blood test.  Sexually transmitted disease (STD) testing.  Diabetes screening. This is done by checking your blood sugar (glucose) after you have not eaten for a while (fasting). You may have this  done every 1-3 years.  Bone density scan. This is done to screen for osteoporosis. You may have this done starting at age 11.  Mammogram. This may be done every 1-2 years. Talk to your health care provider about how often you should have regular  mammograms. Talk with your health care provider about your test results, treatment options, and if necessary, the need for more tests. Vaccines  Your health care provider may recommend certain vaccines, such as:  Influenza vaccine. This is recommended every year.  Tetanus, diphtheria, and acellular pertussis (Tdap, Td) vaccine. You may need a Td booster every 10 years.  Zoster vaccine. You may need this after age 38.  Pneumococcal 13-valent conjugate (PCV13) vaccine. One dose is recommended after age 63.  Pneumococcal polysaccharide (PPSV23) vaccine. One dose is recommended after age 94. Talk to your health care provider about which screenings and vaccines you need and how often you need them. This information is not intended to replace advice given to you by your health care provider. Make sure you discuss any questions you have with your health care provider. Document Released: 09/05/2015 Document Revised: 04/28/2016 Document Reviewed: 06/10/2015 Elsevier Interactive Patient Education  2017 [employer] Prevention in the Home Falls can cause injuries. They can happen to people of all ages. There are many things you can do to make your home safe and to help prevent falls. What can I do on the outside of my home?  Regularly fix the edges of walkways and driveways and fix any cracks.  Remove anything that might make you trip as you walk through a door, such as a raised step or threshold.  Trim any bushes or trees on the path to your home.  Use bright outdoor lighting.  Clear any walking paths of anything that might make someone trip, such as rocks or tools.  Regularly check to see if handrails are loose or broken. Make sure that both sides of any steps have handrails.  Any raised decks and porches should have guardrails on the edges.  Have any leaves, snow, or ice cleared regularly.  Use sand or salt on walking paths during winter.  Clean up any spills in your garage  right away. This includes oil or grease spills. What can I do in the bathroom?  Use night lights.  Install grab bars by the toilet and in the tub and shower. Do not use towel bars as grab bars.  Use non-skid mats or decals in the tub or shower.  If you need to sit down in the shower, use a plastic, non-slip stool.  Keep the floor dry. Clean up any water that spills on the floor as soon as it happens.  Remove soap buildup in the tub or shower regularly.  Attach bath mats securely with double-sided non-slip rug tape.  Do not have throw rugs and other things on the floor that can make you trip. What can I do in the bedroom?  Use night lights.  Make sure that you have a light by your bed that is easy to reach.  Do not use any sheets or blankets that are too big for your bed. They should not hang down onto the floor.  Have a firm chair that has side arms. You can use this for support while you get dressed.  Do not have throw rugs and other things on the floor that can make you trip. What can I do in the kitchen?  Clean up any spills  right away.  Avoid walking on wet floors.  Keep items that you use a lot in easy-to-reach places.  If you need to reach something above you, use a strong step stool that has a grab bar.  Keep electrical cords out of the way.  Do not use floor polish or wax that makes floors slippery. If you must use wax, use non-skid floor wax.  Do not have throw rugs and other things on the floor that can make you trip. What can I do with my stairs?  Do not leave any items on the stairs.  Make sure that there are handrails on both sides of the stairs and use them. Fix handrails that are broken or loose. Make sure that handrails are as long as the stairways.  Check any carpeting to make sure that it is firmly attached to the stairs. Fix any carpet that is loose or worn.  Avoid having throw rugs at the top or bottom of the stairs. If you do have throw rugs,  attach them to the floor with carpet tape.  Make sure that you have a light switch at the top of the stairs and the bottom of the stairs. If you do not have them, ask someone to add them for you. What else can I do to help prevent falls?  Wear shoes that:  Do not have high heels.  Have rubber bottoms.  Are comfortable and fit you well.  Are closed at the toe. Do not wear sandals.  If you use a stepladder:  Make sure that it is fully opened. Do not climb a closed stepladder.  Make sure that both sides of the stepladder are locked into place.  Ask someone to hold it for you, if possible.  Clearly mark and make sure that you can see:  Any grab bars or handrails.  First and last steps.  Where the edge of each step is.  Use tools that help you move around (mobility aids) if they are needed. These include:  Canes.  Walkers.  Scooters.  Crutches.  Turn on the lights when you go into a dark area. Replace any light bulbs as soon as they burn out.  Set up your furniture so you have a clear path. Avoid moving your furniture around.  If any of your floors are uneven, fix them.  If there are any pets around you, be aware of where they are.  Review your medicines with your doctor. Some medicines can make you feel dizzy. This can increase your chance of falling. Ask your doctor what other things that you can do to help prevent falls. This information is not intended to replace advice given to you by your health care provider. Make sure you discuss any questions you have with your health care provider. Document Released: 06/05/2009 Document Revised: 01/15/2016 Document Reviewed: 09/13/2014 Elsevier Interactive Patient Education  2017 [employer].

## 2019-10-05 NOTE — Progress Notes (Signed)
This visit is being conducted via phone call due to the COVID-19 pandemic. This patient has given me verbal consent via phone to conduct this visit, patient states they are participating from their home address. Some vital signs may be absent or patient reported.   Patient identification: identified by name, DOB, and current address.  Location provider: [facility], Office Persons participating in the virtual visit: Denman George LPN, patient, and Dr. Billey Chang     Subjective:   Kristi Jones is a 74 y.o. female who presents for Medicare Annual (Subsequent) preventive examination.  Review of Systems:   Cardiac Risk Factors include: advanced age (>87men, >91 women);hypertension;dyslipidemia    Objective:     Vitals: There were no vitals taken for this visit.  There is no height or weight on file to calculate BMI.  Advanced Directives 10/05/2019 09/28/2018  Does Patient Have a Medical Advance Directive? Yes Yes  Type of Advance Directive Living will;Healthcare Power of Kristi Jones;Living will  Does patient want to make changes to medical advance directive? No - Patient declined -  Copy of [place] in Chart? Yes - validated most recent copy scanned in chart (See row information) No - copy requested    Tobacco Social History   Tobacco Use  Smoking Status Never Smoker  Smokeless Tobacco Never Used     Counseling given: Not Answered   Clinical Intake:  Pre-visit preparation completed: Yes  Pain : No/denies pain  Diabetes: No  How often do you need to have someone help you when you read instructions, pamphlets, or other written materials from your doctor or pharmacy?: 1 - Never  Interpreter Needed?: No  Information entered by :: Denman George LPN  Past Medical History:  Diagnosis Date  . Hyperlipemia   . Hypertension   . Osteopenia 10/31/2013   DEXA 01/2013- T+ -2.3 with elvated FRAX of 20.9%, rec consideration  of [place] Party- patient declines now 04/2014/cla  . Thyroid disease    Past Surgical History:  Procedure Laterality Date  . APPENDECTOMY    . HAND SURGERY  2013   left hand due to trauma  . I & D EXTREMITY  01/19/2012   Procedure: IRRIGATION AND DEBRIDEMENT EXTREMITY;  Surgeon: Schuyler Amor, MD;  Location: [facility];  Service: Orthopedics;  Laterality: Left;  with common digital artery repair  . TONSILLECTOMY AND ADENOIDECTOMY    . VAGINAL HYSTERECTOMY     vaginal, partial, DUB    Family History  Problem Relation Age of Onset  . Alzheimer's disease Mother   . Diabetes Mother   . Heart disease Mother        CHF   . Hypertension Mother   . Heart disease Father   . Breast cancer Sister   . Hyperlipidemia Brother    Social History   Socioeconomic History  . Marital status: Married    Spouse name: Not on file  . Number of children: Not on file  . Years of education: Not on file  . Highest education level: Not on file  Occupational History  . 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    Employer: [employer]  Tobacco Use  . Smoking status: Never Smoker  . Smokeless tobacco: Never Used  Substance and Sexual Activity  . Alcohol use: Yes    Comment: occ  . Drug use: No  . Sexual activity: Yes  Other Topics Concern  . Not on file  Social History Narrative   Physically active with  classes at gym    Social Determinants of Health   Financial Resource Strain:   . Difficulty of Paying Living Expenses: Not on file  Food Insecurity:   . Worried About Charity fundraiser in the Last Year: Not on file  . Ran Out of Food in the Last Year: Not on file  Transportation Needs:   . Lack of Transportation (Medical): Not on file  . Lack of Transportation (Non-Medical): Not on file  Physical Activity:   . Days of Exercise per Week: Not on file  . Minutes of Exercise per Session: Not on file  Stress:   . Feeling of Stress : Not on file  Social Connections:   . Frequency of Communication with Friends and  Family: Not on file  . Frequency of Social Gatherings with Friends and Family: Not on file  . Attends Religious Services: Not on file  . Active Member of Clubs or Organizations: Not on file  . Attends Archivist Meetings: Not on file  . Marital Status: Not on file    Outpatient Encounter Medications as of 10/05/2019  Medication Sig  . alendronate (FOSAMAX) 70 MG tablet TAKE 1 TAB ONCE A WEEK, AT LEAST 30 MIN BEFORE 1ST FOOD.DO NOT LIE DOWN FOR 30 MIN AFTER TAKING.  Marland Kitchen aspirin EC 81 MG tablet Take 81 mg by mouth daily.  . folic acid (FOLVITE) 1 MG tablet TAKE 1 TABLET ONCE DAILY.  . hydrochlorothiazide (HYDRODIURIL) 25 MG tablet Take 1 tablet (25 mg total) by mouth every morning.  Marland Kitchen levothyroxine (SYNTHROID) 88 MCG tablet Take 1 tablet (88 mcg total) by mouth daily.  Marland Kitchen losartan (COZAAR) 50 MG tablet Take 1 tablet (50 mg total) by mouth daily.  . rosuvastatin (CRESTOR) 5 MG tablet Take 1 tablet (5 mg total) by mouth at bedtime.  . terbinafine (LAMISIL) 250 MG tablet Take 1 tablet (250 mg total) by mouth daily.   No facility-administered encounter medications on file as of 10/05/2019.    Activities of Daily Living In your present state of health, do you have any difficulty performing the following activities: 10/05/2019  Hearing? N  Vision? N  Difficulty concentrating or making decisions? N  Walking or climbing stairs? N  Dressing or bathing? N  Doing errands, shopping? N  Preparing Food and eating ? N  Using the Toilet? N  In the past six months, have you accidently leaked urine? N  Do you have problems with loss of bowel control? N  Managing your Medications? N  Managing your Finances? N  Housekeeping or managing your Housekeeping? N  Some recent data might be hidden    Patient Care Team: Leamon Arnt, MD as PCP - General (Family Medicine) Dermatology, [place], Remo Lipps, [place] as Consulting Physician (Optometry)    Assessment:   This is a routine wellness  examination for [name].  Exercise Activities and Dietary recommendations Current Exercise Habits: Structured exercise class, Type of exercise: yoga;calisthenics, Time (Minutes): 60, Frequency (Times/Week): 3, Weekly Exercise (Minutes/Week): 180, Intensity: Moderate  Goals    . Weight (lb) < 135 lb (61.2 kg)     Lose weight by decreasing carb intake.        Fall Risk Fall Risk  10/05/2019 09/28/2018 01/10/2018  Falls in the past year? 0 0 No  Number falls in past yr: 0 - -  Injury with Fall? 0 - -  Follow up Falls evaluation completed;Education provided;Falls prevention discussed - -   Is the patient's home  free of loose throw rugs in walkways, pet beds, electrical cords, etc?   yes      Grab bars in the bathroom? yes      Handrails on the stairs?   yes      Adequate lighting?   yes   Depression Screen PHQ 2/9 Scores 10/05/2019 09/28/2018 01/10/2018  PHQ - 2 Score 0 0 0  PHQ- 9 Score - - 0     Cognitive Function Cognitive Testing  Alert? Yes         Normal Appearance? Yes  Oriented to person? Yes           Place? Yes  Time? Yes  Recall of three objects? Yes  Can perform simple calculations? Yes  Displays appropriate judgment? Yes  Can read the correct time from a watch face? Yes   MMSE - Mini Mental State Exam 09/28/2018  Orientation to time 5  Orientation to Place 5  Registration 3  Attention/ Calculation 5  Recall 3  Language- name 2 objects 2  Language- repeat 1  Language- follow 3 step command 3  Language- read & follow direction 1  Write a sentence 1  Copy design 1  Total score 30        Immunization History  Administered Date(s) Administered  . Influenza, High Dose Seasonal PF 06/04/2015  . Tdap 11/09/2010    Qualifies for Shingles Vaccine?Discussed and patient will check with pharmacy for coverage.  Patient education handout provided   Screening Tests Health Maintenance  Topic Date Due  . PNA vac Low Risk Adult (1 of 2 - PCV13) 01/23/2020 (Originally  11/09/2011)  . MAMMOGRAM  02/21/2020  . TETANUS/TDAP  11/08/2020  . DEXA SCAN  02/20/2022  . COLONOSCOPY  06/16/2022  . INFLUENZA VACCINE  Completed  . Hepatitis C Screening  Completed    Cancer Screenings: Lung: Low Dose CT Chest recommended if Age 90-80 years, 30 pack-year currently smoking OR have quit w/in 15years. Patient does not qualify. Breast:  Up to date on Mammogram? Yes   Up to date of Bone Density/Dexa? Yes Colorectal: colonoscopy 06/16/12; repeat in 10 years     Plan:  I have personally reviewed and addressed the Medicare Annual Wellness questionnaire and have noted the following in the patient's chart:  A. Medical and social history B. Use of alcohol, tobacco or illicit drugs  C. Current medications and supplements D. Functional ability and status E.  Nutritional status F.  Physical activity G. Advance directives H. List of other physicians I.  Hospitalizations, surgeries, and ER visits in previous 12 months J.  [place] such as hearing and vision if needed, cognitive and depression L. Referrals, records requested, and appointments- none   In addition, I have reviewed and discussed with patient certain preventive protocols, quality metrics, and best practice recommendations. A written personalized care plan for preventive services as well as general preventive health recommendations were provided to patient.   Signed,  Denman George, LPN  Nurse Health Advisor   Nurse Notes: Patient has completed Covid vaccine

## 2019-10-09 DIAGNOSIS — H524 Presbyopia: Secondary | ICD-10-CM | POA: Diagnosis not present

## 2019-10-09 DIAGNOSIS — H52223 Regular astigmatism, bilateral: Secondary | ICD-10-CM | POA: Diagnosis not present

## 2019-10-09 DIAGNOSIS — H5203 Hypermetropia, bilateral: Secondary | ICD-10-CM | POA: Diagnosis not present

## 2019-10-09 DIAGNOSIS — H2513 Age-related nuclear cataract, bilateral: Secondary | ICD-10-CM | POA: Diagnosis not present

## 2019-10-10 ENCOUNTER — Other Ambulatory Visit: Payer: Self-pay | Admitting: Family Medicine

## 2020-01-29 ENCOUNTER — Emergency Department (HOSPITAL_COMMUNITY)
Admission: EM | Admit: 2020-01-29 | Discharge: 2020-01-30 | Disposition: A | Discharge status: Home / Self Care | Payer: Medicare Other | Attending: Emergency Medicine | Admitting: Emergency Medicine

## 2020-01-29 ENCOUNTER — Encounter (HOSPITAL_COMMUNITY): Payer: Self-pay | Admitting: Emergency Medicine

## 2020-01-29 ENCOUNTER — Other Ambulatory Visit: Payer: Self-pay

## 2020-01-29 ENCOUNTER — Emergency Department (HOSPITAL_COMMUNITY): Payer: Medicare Other

## 2020-01-29 DIAGNOSIS — R1031 Right lower quadrant pain: Secondary | ICD-10-CM | POA: Insufficient documentation

## 2020-01-29 DIAGNOSIS — Z79899 Other long term (current) drug therapy: Secondary | ICD-10-CM | POA: Insufficient documentation

## 2020-01-29 DIAGNOSIS — I10 Essential (primary) hypertension: Secondary | ICD-10-CM | POA: Insufficient documentation

## 2020-01-29 DIAGNOSIS — E039 Hypothyroidism, unspecified: Secondary | ICD-10-CM | POA: Insufficient documentation

## 2020-01-29 DIAGNOSIS — K4090 Unilateral inguinal hernia, without obstruction or gangrene, not specified as recurrent: Secondary | ICD-10-CM

## 2020-01-29 LAB — URINALYSIS, ROUTINE W REFLEX MICROSCOPIC
Bilirubin Urine: NEGATIVE
Glucose, UA: NEGATIVE mg/dL
Ketones, ur: 20 mg/dL — AB
Nitrite: NEGATIVE
Protein, ur: NEGATIVE mg/dL
Specific Gravity, Urine: 1.009 (ref 1.005–1.030)
pH: 5 (ref 5.0–8.0)

## 2020-01-29 LAB — COMPREHENSIVE METABOLIC PANEL
ALT: 16 U/L (ref 0–44)
AST: 18 U/L (ref 15–41)
Albumin: 3.9 g/dL (ref 3.5–5.0)
Alkaline Phosphatase: 45 U/L (ref 38–126)
Anion gap: 10 (ref 5–15)
BUN: 11 mg/dL (ref 8–23)
CO2: 25 mmol/L (ref 22–32)
Calcium: 9.2 mg/dL (ref 8.9–10.3)
Chloride: 105 mmol/L (ref 98–111)
Creatinine, Ser: 0.69 mg/dL (ref 0.44–1.00)
GFR calc Af Amer: >60 mL/min (ref >60)
GFR calc non Af Amer: >60 mL/min (ref >60)
Glucose, Bld: 118 mg/dL — ABNORMAL HIGH (ref 70–99)
Potassium: 3.4 mmol/L — ABNORMAL LOW (ref 3.5–5.1)
Sodium: 140 mmol/L (ref 135–145)
Total Bilirubin: 0.7 mg/dL (ref 0.3–1.2)
Total Protein: 6.5 g/dL (ref 6.5–8.1)

## 2020-01-29 LAB — CBC
HCT: 38.1 % (ref 36.0–46.0)
Hemoglobin: 12.6 g/dL (ref 12.0–15.0)
MCH: 29.4 pg (ref 26.0–34.0)
MCHC: 33.1 g/dL (ref 30.0–36.0)
MCV: 88.8 fL (ref 80.0–100.0)
Platelets: 185 10*3/uL (ref 150–400)
RBC: 4.29 MIL/uL (ref 3.87–5.11)
RDW: 13.2 % (ref 11.5–15.5)
WBC: 9.6 10*3/uL (ref 4.0–10.5)
nRBC: 0 % (ref 0.0–0.2)

## 2020-01-29 LAB — LIPASE, BLOOD: Lipase: 28 U/L (ref 11–51)

## 2020-01-29 MED ORDER — IOHEXOL 300 MG/ML  SOLN
100.0000 mL | Freq: Once | INTRAMUSCULAR | Status: AC | PRN
  Administered 2020-01-29: 100 mL via INTRAVENOUS

## 2020-01-29 MED ORDER — SODIUM CHLORIDE 0.9% FLUSH: 3.0000 mL | Freq: Once | INTRAVENOUS | Status: DC

## 2020-01-29 NOTE — ED Triage Notes (Signed)
Pt brought to ED by [facility] from home for c/o right lower quadrant that started today at 1900, per EMS pt has a palpable mass on her RLQ, pt states her appendix was removed long time ago. 25 mcg Fentanyl, 4mg  Zofran IV given by EMS prior to arrival. BP 170/80, HR 62, R-16, SPO2 98% RA.

## 2020-01-29 NOTE — ED Provider Notes (Addendum)
[facility] EMERGENCY DEPARTMENT Provider Note   CSN: [id number] Arrival date & time: 01/29/20  2144     History Chief Complaint  Patient presents with  . Abdominal Pain    Kristi Jones is a 74 y.o. female.  HPI    Patient presents with concern of abdominal pain. She was in her usual state of health until about 4 hours prior to my evaluation. She notes that she was cooking dinner when she felt sudden onset pain in the right lower quadrant.  Pain was sharp, severe, and she felt as though she had a palpable mass in the area.  She does have a history of prior appendectomy, partial hysterectomy. Since onset pain is improved with 25 mcg of fentanyl, 4 mg Zofran.  Currently patient has no pain. She notes that she has had less severe similar episodes in the past, though none recently. Patient has recently been traveling, but has no other recent changes from diet, medication, activity. Patient is accompanied by her husband.    Past Medical History:  Diagnosis Date  . Hyperlipemia   . Hypertension   . Osteopenia 10/31/2013   DEXA 01/2013- T+ -2.3 with elvated FRAX of 20.9%, rec consideration of [place] Party- patient declines now 04/2014/cla  . Thyroid disease     Patient Active Problem List   Diagnosis Date Noted  . Pneumococcal vaccine refused 01/24/2019  . Breast fibroadenoma, left 11/08/2014  . Hyperlipidemia 05/10/2014  . Age-related osteoporosis without current pathological fracture 10/31/2013  . HTN (hypertension), benign 10/31/2013  . Hypothyroidism 10/31/2013  . Sebaceous cyst, upper back 06/15/2012    Past Surgical History:  Procedure Laterality Date  . APPENDECTOMY    . HAND SURGERY  2013   left hand due to trauma  . I & D EXTREMITY  01/19/2012   Procedure: IRRIGATION AND DEBRIDEMENT EXTREMITY;  Surgeon: Schuyler Amor, MD;  Location: [facility];  Service: Orthopedics;  Laterality: Left;  with common digital artery repair  . TONSILLECTOMY AND  ADENOIDECTOMY    . VAGINAL HYSTERECTOMY     vaginal, partial, DUB      OB History   No obstetric history on file.     Family History  Problem Relation Age of Onset  . Alzheimer's disease Mother   . Diabetes Mother   . Heart disease Mother        CHF   . Hypertension Mother   . Heart disease Father   . Breast cancer Sister   . Hyperlipidemia Brother     Social History   Tobacco Use  . Smoking status: Never Smoker  . Smokeless tobacco: Never Used  Substance Use Topics  . Alcohol use: Yes    Comment: occ  . Drug use: No    Home Medications Prior to Admission medications   Medication Sig Start Date End Date Taking? Authorizing Provider  alendronate (FOSAMAX) 70 MG tablet TAKE 1 TAB ONCE A WEEK, AT LEAST 30 MIN BEFORE 1ST FOOD.DO NOT LIE DOWN FOR 30 MIN AFTER TAKING. Patient taking differently: Take 70 mg by mouth every Sunday.  10/10/19  Yes Leamon Arnt, MD  aspirin EC 81 MG tablet Take 81 mg by mouth daily.   Yes [provider]  folic acid (FOLVITE) 1 MG tablet TAKE 1 TABLET ONCE DAILY. Patient taking differently: Take 1 mg by mouth daily.  08/02/19  Yes Leamon Arnt, MD  hydrochlorothiazide (HYDRODIURIL) 25 MG tablet Take 1 tablet (25 mg total) by mouth every morning.  07/27/19  Yes Leamon Arnt, MD  ibuprofen (ADVIL) 200 MG tablet Take 400 mg by mouth every 6 (six) hours as needed for mild pain.   Yes [provider]  levothyroxine (SYNTHROID) 88 MCG tablet Take 1 tablet (88 mcg total) by mouth daily. 07/27/19  Yes Leamon Arnt, MD  losartan (COZAAR) 50 MG tablet Take 1 tablet (50 mg total) by mouth daily. 07/27/19  Yes Leamon Arnt, MD  rosuvastatin (CRESTOR) 5 MG tablet Take 1 tablet (5 mg total) by mouth at bedtime. Patient taking differently: Take 5 mg by mouth daily.  07/27/19  Yes Leamon Arnt, MD  terbinafine (LAMISIL) 250 MG tablet Take 1 tablet (250 mg total) by mouth daily. 09/11/19  Yes Leamon Arnt, MD    Allergies      Sulfa antibiotics, Sulfa drugs cross reactors, Simvastatin, Penicillins, and Lisinopril  Review of Systems   Review of Systems  Constitutional:       Per HPI, otherwise negative  HENT:       Per HPI, otherwise negative  Respiratory:       Per HPI, otherwise negative  Cardiovascular:       Per HPI, otherwise negative  Gastrointestinal: Positive for abdominal pain. Negative for vomiting.  Endocrine:       Negative aside from HPI  Genitourinary:       Neg aside from HPI   Musculoskeletal:       Per HPI, otherwise negative  Skin: Negative.   Neurological: Negative for syncope.    Physical Exam Updated Vital Signs Ht 5\' 5"  (1.651 m)   Wt 63 kg   BMI 23.11 kg/m   Physical Exam Vitals and nursing note reviewed.  Constitutional:      General: She is not in acute distress.    Appearance: She is well-developed.  HENT:     Head: Normocephalic and atraumatic.  Eyes:     Conjunctiva/sclera: Conjunctivae normal.  Cardiovascular:     Rate and Rhythm: Normal rate and regular rhythm.  Pulmonary:     Effort: Pulmonary effort is normal. No respiratory distress.     Breath sounds: Normal breath sounds. No stridor.  Abdominal:     General: There is no distension.     Tenderness: There is abdominal tenderness in the right lower quadrant.  Skin:    General: Skin is warm and dry.  Neurological:     Mental Status: She is alert and oriented to person, place, and time.     Cranial Nerves: No cranial nerve deficit.      ED Results / Procedures / Treatments   Labs (all labs ordered are listed, but only abnormal results are displayed) Labs Reviewed  CBC  LIPASE, BLOOD  COMPREHENSIVE METABOLIC PANEL  URINALYSIS, ROUTINE W REFLEX MICROSCOPIC    EKG None  Radiology No results found.  Procedures Procedures (including critical care time)  Medications Ordered in ED Medications  sodium chloride flush (NS) 0.9 % injection 3 mL (3 mLs Intravenous Not Given 01/29/20 2207)     ED Course  I have reviewed the triage vital signs and the nursing notes.  Pertinent labs & imaging results that were available during my care of the patient were reviewed by me and considered in my medical decision making (see chart for details).  Well-appearing female presents with right lower quadrant of dental pain, resolved with fentanyl in route. She is awake, alert, with a soft, nonperitoneal abdomen, though with tenderness in the right lower  quadrant. However, the patient is status post appendectomy, partial hysterectomy, there are some suspicion for diverticulitis versus hernia versus urinary etiology for her pain. Patient's initial vital signs physical exam reassuring.   12:15 AM Patient has been aware of findings, CT which I reviewed myself Patient did not have inguinal hernia with fat, but labs otherwise reassuring aside from mild evidence for dehydration.  She has no ongoing symptoms. She and I had a lengthy conversation about the findings, discussed admission versus close outpatient follow-up with her surgical colleagues, the latter to which she is amenable, appropriate for. We also discussed CT findings concerning for splenic angioma, to which the patient will follow up as well.  MDM Number of Diagnoses or Management Options Non-recurrent unilateral inguinal hernia without obstruction or gangrene: new, needed workup Right lower quadrant abdominal pain: new, needed workup   Amount and/or Complexity of Data Reviewed Clinical lab tests: reviewed Tests in the radiology section of CPT: reviewed Tests in the medicine section of CPT: reviewed Decide to obtain previous medical records or to obtain history from someone other than the patient: yes Obtain history from someone other than the patient: yes Discuss the patient with other providers: no Independent visualization of images, tracings, or specimens: yes  Risk of Complications, Morbidity, and/or Mortality Presenting  problems: high Diagnostic procedures: high Management options: high    Final Clinical Impression(s) / ED Diagnoses Final diagnoses:  Right lower quadrant abdominal pain  Non-recurrent unilateral inguinal hernia without obstruction or gangrene     Carmin Muskrat, MD 01/29/20 2255    Carmin Muskrat, MD 01/30/20 [phone]

## 2020-01-30 NOTE — Discharge Instructions (Addendum)
These be sure to schedule follow-up with our surgery colleagues in the coming days.  If you develop new, or concerning changes, be sure to return here.

## 2020-01-31 ENCOUNTER — Other Ambulatory Visit (HOSPITAL_COMMUNITY)
Admission: RE | Admit: 2020-01-31 | Discharge: 2020-01-31 | Disposition: A | Discharge status: Home / Self Care | Payer: Medicare Other | Source: Ambulatory Visit | Attending: Surgery | Admitting: Surgery

## 2020-01-31 ENCOUNTER — Other Ambulatory Visit (HOSPITAL_COMMUNITY): Payer: Self-pay | Admitting: Surgery

## 2020-01-31 DIAGNOSIS — Z20822 Contact with and (suspected) exposure to covid-19: Secondary | ICD-10-CM | POA: Diagnosis not present

## 2020-01-31 DIAGNOSIS — I10 Essential (primary) hypertension: Secondary | ICD-10-CM | POA: Diagnosis not present

## 2020-01-31 DIAGNOSIS — Z01812 Encounter for preprocedural laboratory examination: Secondary | ICD-10-CM | POA: Diagnosis not present

## 2020-01-31 DIAGNOSIS — K4190 Unilateral femoral hernia, without obstruction or gangrene, not specified as recurrent: Secondary | ICD-10-CM | POA: Diagnosis not present

## 2020-01-31 LAB — SARS CORONAVIRUS 2 (TAT 6-24 HRS): SARS Coronavirus 2: NEGATIVE

## 2020-01-31 NOTE — Progress Notes (Signed)
DUE TO COVID-19 ONLY ONE VISITOR IS ALLOWED TO COME WITH YOU AND STAY IN THE WAITING ROOM ONLY DURING PRE OP AND PROCEDURE DAY OF SURGERY. THE 1 VISITOR MAY VISIT WITH YOU AFTER SURGERY IN YOUR PRIVATE ROOM DURING VISITING HOURS ONLY!  YOU NEED TO HAVE A COVID 19 TEST ON_______ @_______ , THIS TEST MUST BE DONE BEFORE SURGERY, COME  Kristi Jones , [id number].  ([place]) ONCE YOUR COVID TEST IS COMPLETED, PLEASE BEGIN THE QUARANTINE INSTRUCTIONS AS OUTLINED IN YOUR HANDOUT.                Kristi Jones  01/31/2020   Your procedure is scheduled on:  02/04/20   Report to [facility] Main  Entrance   Report to admitting at  1130 AM     Call this number if you have problems the morning of surgery [phone]    Remember: Do not eat food  drink :After Midnight. BRUSH YOUR TEETH MORNING OF SURGERY AND RINSE YOUR MOUTH OUT, NO CHEWING GUM CANDY OR MINTS. mAY HAVE CLEAR LIQUIDS FROM 12 MIDNITE UNTIL 1030AM MORNING OF SURGERY THEN NOTHING BY MOUTH.      Take these medicines the morning of surgery with A SIP OF WATER:  SYNTHROID                                 You may not have any metal on your body including hair pins and              piercings  Do not wear jewelry, make-up, lotions, powders or perfumes, deodorant             Do not wear nail polish on your fingernails.  Do not shave  48 hours prior to surgery.               Do not bring valuables to the hospital. [place].  Contacts, dentures or bridgework may not be worn into surgery.  Leave suitcase in the car. After surgery it may be brought to your room.     Patients discharged the day of surgery will not be allowed to drive home. IF YOU ARE HAVING SURGERY AND GOING HOME THE SAME DAY, YOU MUST HAVE AN ADULT TO DRIVE YOU HOME AND BE WITH YOU FOR 24 HOURS. YOU MAY GO HOME BY TAXI OR UBER OR ORTHERWISE, BUT AN ADULT MUST ACCOMPANY YOU HOME AND  STAY WITH YOU FOR 24 HOURS.  Name and phone number of your driver:                Please read over the following fact sheets you were given: _____________________________________________________________________             NO SOLID FOOD AFTER MIDNIGHT THE NIGHT PRIOR TO SURGERY. NOTHING BY MOUTH EXCEPT CLEAR LIQUIDS UNTIL   1030AM  . PLEASE FINISH ENSURE DRINK PER SURGEON ORDER  WHICH NEEDS TO BE COMPLETED AT . 1030AM   CLEAR LIQUID DIET   Foods Allowed  Foods Excluded  Coffee and tea, regular and decaf                             liquids that you cannot  Plain Jell-O any favor except red or purple                                           see through such as: Fruit ices (not with fruit pulp)                                     milk, soups, orange juice  Iced Popsicles                                    All solid food Carbonated beverages, regular and diet                                    Cranberry, grape and apple juices Sports drinks like Gatorade Lightly seasoned clear broth or consume(fat free) Sugar, honey syrup  Sample Menu Breakfast                                Lunch                                     Supper Cranberry juice                    Beef broth                            Chicken broth Jell-O                                     Grape juice                           Apple juice Coffee or tea                        Jell-O                                      Popsicle                                                Coffee or tea                        Coffee or tea  _____________________________________________________________________  [facility] - Preparing for Surgery Before surgery, you can play an important role.  Because skin is not sterile, your skin  needs to be as free of germs as possible.  You can reduce the number of germs on your skin by washing with CHG (chlorahexidine gluconate) soap  before surgery.  CHG is an antiseptic cleaner which kills germs and bonds with the skin to continue killing germs even after washing. Please DO NOT use if you have an allergy to CHG or antibacterial soaps.  If your skin becomes reddened/irritated stop using the CHG and inform your nurse when you arrive at Short Stay. Do not shave (including legs and underarms) for at least 48 hours prior to the first CHG shower.  You may shave your face/neck. Please follow these instructions carefully:  1.  Shower with CHG Soap the night before surgery and the  morning of Surgery.  2.  If you choose to wash your hair, wash your hair first as usual with your  normal  shampoo.  3.  After you shampoo, rinse your hair and body thoroughly to remove the  shampoo.                           4.  Use CHG as you would any other liquid soap.  You can apply chg directly  to the skin and wash                       Gently with a scrungie or clean washcloth.  5.  Apply the CHG Soap to your body ONLY FROM THE NECK DOWN.   Do not use on face/ open                           Wound or open sores. Avoid contact with eyes, ears mouth and genitals (private parts).                       Wash face,  Genitals (private parts) with your normal soap.             6.  Wash thoroughly, paying special attention to the area where your surgery  will be performed.  7.  Thoroughly rinse your body with warm water from the neck down.  8.  DO NOT shower/wash with your normal soap after using and rinsing off  the CHG Soap.                9.  Pat yourself dry with a clean towel.            10.  Wear clean pajamas.            11.  Place clean sheets on your bed the night of your first shower and do not  sleep with pets. Day of Surgery : Do not apply any lotions/deodorants the morning of surgery.  Please wear clean clothes to the hospital/surgery center.  FAILURE TO FOLLOW THESE INSTRUCTIONS MAY RESULT IN THE CANCELLATION OF YOUR SURGERY PATIENT  SIGNATURE_________________________________  NURSE SIGNATURE__________________________________  ________________________________________________________________________

## 2020-02-01 ENCOUNTER — Encounter (HOSPITAL_COMMUNITY): Payer: Self-pay

## 2020-02-01 ENCOUNTER — Encounter (HOSPITAL_COMMUNITY)
Admission: RE | Admit: 2020-02-01 | Discharge: 2020-02-01 | Disposition: A | Discharge status: Home / Self Care | Payer: Medicare Other | Source: Ambulatory Visit | Attending: Surgery | Admitting: Surgery

## 2020-02-01 ENCOUNTER — Other Ambulatory Visit: Payer: Self-pay

## 2020-02-01 DIAGNOSIS — Z01810 Encounter for preprocedural cardiovascular examination: Secondary | ICD-10-CM | POA: Insufficient documentation

## 2020-02-01 HISTORY — DX: Hypothyroidism, unspecified: E03.9

## 2020-02-01 HISTORY — DX: Inflammatory liver disease, unspecified: K75.9

## 2020-02-01 HISTORY — DX: Unspecified osteoarthritis, unspecified site: M19.90

## 2020-02-01 NOTE — H&P (Signed)
Kristi Jones  Location: [facility] Patient #: [id number] DOB: May 15, 1946 Married / Language: English / Race: White Female  History of Present Illness   The patient is a 74 year old female who presents with a complaint of hernia.  The PCP is Dr. Billey Chang  She comes by herself.  She's had 2 episodes where she has felt something in her right groin. She did not identify this as a hernia. However, on Tuesday, June 8 she was lifting spaghetti and felt a pain in her right groin. She laid down for 2 hours didn't get better. Her husband called 70 and she was taken to [facility] ER by ambulance. She went to the [facility] ER on 6/8 for pain in the hernia. She had a CT scan of her abdomen on 01/30/2020 which showed 1. Large fat containing right groin hernia with evidence of fat strangulation. Configuration suggestive of a direct inguinal hernia. 2. Colonic diverticulosis with some segmental thickening of the sigmoid colon but without active inflammatory change at this time to suggest an acute diverticulitis. Could reflect sequela of chronic inflammation or prior diverticulitis though should correlate with most recent colonoscopy or consider outpatient direct visualization if not recently performed. 3. Multiple hypoattenuating structures throughout the spleen including a more complex multi septate structure in the posterior spleen measuring up to 1.8 cm in size. Findings are favored to represent a benign process such as a hemangioma. In the absence of known malignancy, consider 6-12 month follow-up splenic MRI. This recommendation follows ACR consensus guidelines: White Paper of the ACR Incidental Findings Committee II on Splenic and Nodal Findings. J Am Coll Radiol 2013;10:789-794. 4. Aortic Atherosclerosis (ICD10-I70.0). (she had access to this on MyChart and I gave her a printed copy of the report)  I discussed the indications and complications of hernia surgery with the  patient. I discussed both the laparoscopic and open approach to hernia repair.. The potential risks of hernia surgery include, but are not limited to, bleeding, infection, open surgery, nerve injury, and recurrence of the hernia. I provided the patient literature about hernia surgery. We talked about the use of mesh in hernias and their risks. The risk of mesh include chronic infection, erosion to other structures, and chronic pain.  Plan: 1. Open repair of the right femoral/inguinal hernia - I think that this has incarcerated fat  Review of Systems as stated in this history (HPI) or in the review of systems. Otherwise all other 12 point ROS are negative  Past Medical History: 1. History of appendectomy 2. Hysterectomy - 1978 3. HTN 4. Last colonoscopy about 2013 - I talked to her about updating this in face of the CT scan report.  Social History: Married 3 children: 80 yo son in [place], 41 yo daughter, and 55 yo son  The patient's family history was non contributory.  Past Surgical History (April Staton, [place]; 01/31/2020 11:08 AM) Appendectomy  Hysterectomy (not due to cancer) - Partial  Tonsillectomy   Diagnostic Studies History (April Staton, CMA; 01/31/2020 11:08 AM) Colonoscopy  5-10 years ago Mammogram  within last year Pap Smear  >5 years ago  Allergies (April Staton, CMA; 01/31/2020 11:09 AM) Penicillins  Sulfa Antibiotics  Anaphylaxis. Lisinopril *CHEMICALS*  Cough.  Medication History (April Staton, CMA; 01/31/2020 11:12 AM) Fosamax (70MG  Tablet, Oral) Active. Levothyroxine Sodium (88MCG Tablet, Oral) Active. Folic Acid (1MG  Tablet, Oral) Active. Aspirin (81MG  Tablet, Oral) Active. Rosuvastatin Calcium (5MG  Tablet, Oral) Active. Losartan Potassium (50MG  Tablet, Oral) Active. hydroCHLOROthiazide (25MG  Tablet,  Oral) Active. Terbinafine HCl (250MG  Tablet, Oral) Active. Medications Reconciled  Social History (April Staton,  CMA; 01/31/2020 11:08 AM) Alcohol use  Moderate alcohol use. Caffeine use  Coffee. No drug use  Tobacco use  Former smoker.  Family History (April Staton, [place]; 01/31/2020 11:08 AM) Breast Cancer  Sister. Colon Polyps  Father. Diabetes Mellitus  Brother, Mother. Hypertension  Brother, Mother, Sister. Kidney Disease  Mother.  Pregnancy / Birth History (April Staton, [place]; 01/31/2020 11:08 AM) Age at menarche  76 years. Age of menopause  51-55 Contraceptive History  Oral contraceptives. Gravida  3 Length (months) of breastfeeding  7-12 Maternal age  72-25 Para  3  Other Problems (April Staton, CMA; 01/31/2020 11:08 AM) Hepatitis  High blood pressure  Hypercholesterolemia  Inguinal Hernia  Thyroid Disease     Review of Systems (April Staton CMA; 01/31/2020 11:08 AM) General Not Present- Appetite Loss, Chills, Fatigue, Fever, Night Sweats, Weight Gain and Weight Loss. Skin Not Present- Change in Wart/Mole, Dryness, Hives, Jaundice, New Lesions, Non-Healing Wounds, Rash and Ulcer. HEENT Present- Wears glasses/contact lenses. Not Present- Earache, Hearing Loss, Hoarseness, Nose Bleed, Oral Ulcers, Ringing in the Ears, Seasonal Allergies, Sinus Pain, Sore Throat, Visual Disturbances and Yellow Eyes. Gastrointestinal Not Present- Abdominal Pain, Bloating, Bloody Stool, Change in Bowel Habits, Chronic diarrhea, Constipation, Difficulty Swallowing, Excessive gas, Gets full quickly at meals, Hemorrhoids, Indigestion, Nausea, Rectal Pain and Vomiting. Female Genitourinary Not Present- Frequency, Nocturia, Painful Urination, Pelvic Pain and Urgency. Musculoskeletal Not Present- Back Pain, Joint Pain, Joint Stiffness, Muscle Pain, Muscle Weakness and Swelling of Extremities. Neurological Not Present- Decreased Memory, Fainting, Headaches, Numbness, Seizures, Tingling, Tremor, Trouble walking and Weakness. Psychiatric Not Present- Anxiety, Bipolar, Change in Sleep Pattern,  Depression, Fearful and Frequent crying. Endocrine Not Present- Cold Intolerance, Excessive Hunger, Hair Changes, Heat Intolerance, Hot flashes and New Diabetes. Hematology Not Present- Blood Thinners, Easy Bruising, Excessive bleeding, Gland problems, HIV and Persistent Infections.  Vitals (April Staton CMA; 01/31/2020 11:18 AM) 01/31/2020 11:12 AM Weight: 135.38 lb Temp.: 98.25F (Temporal)  Pulse: 96 (Regular)  P.OX: 97% (Room air) BP: 172/84(Sitting, Left Arm, Standard)   Physical Exam  General: WN WF who is alert and generally healthy appearing. She is wearing a mask. HEENT: Normal. Pupils equal.  Neck: Supple. No mass. No thyroid mass. Lymph Nodes: No supraclavicular or cervical nodes.  Lungs: Clear to auscultation and symmetric breath sounds. Heart: RRR. No murmur or rub.  Abdomen: Soft. Normal bowel sounds.  She has a right lower quadrant scar  She has a mass in her right groin. I think it lays below the inguinal ligament - so I think that this is a femoral hernia. It is tender and I cannot reduce the hernia. By CT scan, this contains fat.  Extremities: Good strength and ROM in upper and lower extremities.  Neurologic: Grossly intact to motor and sensory function. Psychiatric: Has normal mood and affect. Behavior is normal.   Assessment & Plan  1.  FEMORAL HERNIA OF RIGHT SIDE (K41.90)  Plan:  1. Open repair of the right femoral/inguinal hernia - I think that this has incarcerated fat  2. HYPERTENSION (I10) 3. Last colonoscopy about 2013 -   I talked to her about updating this in face of the CT scan report.  Alphonsa Overall, MD, [facility] Office phone:  [phone]

## 2020-02-04 ENCOUNTER — Ambulatory Visit (HOSPITAL_COMMUNITY): Payer: Medicare Other | Admitting: Registered Nurse

## 2020-02-04 ENCOUNTER — Other Ambulatory Visit: Payer: Self-pay

## 2020-02-04 ENCOUNTER — Encounter (HOSPITAL_COMMUNITY)
Admission: RE | Disposition: A | Discharge status: Home / Self Care | Payer: Self-pay | Source: Home / Self Care | Attending: Surgery

## 2020-02-04 ENCOUNTER — Encounter (HOSPITAL_COMMUNITY): Payer: Self-pay | Admitting: Surgery

## 2020-02-04 ENCOUNTER — Ambulatory Visit (HOSPITAL_COMMUNITY)
Admission: RE | Admit: 2020-02-04 | Discharge: 2020-02-04 | Disposition: A | Discharge status: Home / Self Care | Payer: Medicare Other | Attending: Surgery | Admitting: Surgery

## 2020-02-04 DIAGNOSIS — Z882 Allergy status to sulfonamides status: Secondary | ICD-10-CM | POA: Diagnosis not present

## 2020-02-04 DIAGNOSIS — Z880 Allergy status to penicillin: Secondary | ICD-10-CM | POA: Insufficient documentation

## 2020-02-04 DIAGNOSIS — I10 Essential (primary) hypertension: Secondary | ICD-10-CM | POA: Insufficient documentation

## 2020-02-04 DIAGNOSIS — K4130 Unilateral femoral hernia, with obstruction, without gangrene, not specified as recurrent: Secondary | ICD-10-CM | POA: Insufficient documentation

## 2020-02-04 DIAGNOSIS — Z87891 Personal history of nicotine dependence: Secondary | ICD-10-CM | POA: Insufficient documentation

## 2020-02-04 DIAGNOSIS — Z888 Allergy status to other drugs, medicaments and biological substances status: Secondary | ICD-10-CM | POA: Insufficient documentation

## 2020-02-04 DIAGNOSIS — E039 Hypothyroidism, unspecified: Secondary | ICD-10-CM | POA: Diagnosis not present

## 2020-02-04 DIAGNOSIS — Z79899 Other long term (current) drug therapy: Secondary | ICD-10-CM | POA: Diagnosis not present

## 2020-02-04 DIAGNOSIS — Z7982 Long term (current) use of aspirin: Secondary | ICD-10-CM | POA: Insufficient documentation

## 2020-02-04 DIAGNOSIS — Z79890 Hormone replacement therapy (postmenopausal): Secondary | ICD-10-CM | POA: Diagnosis not present

## 2020-02-04 DIAGNOSIS — E7800 Pure hypercholesterolemia, unspecified: Secondary | ICD-10-CM | POA: Diagnosis not present

## 2020-02-04 HISTORY — PX: INGUINAL HERNIA REPAIR: SHX194

## 2020-02-04 SURGERY — REPAIR, HERNIA, INGUINAL, ADULT
Anesthesia: General | Laterality: Right

## 2020-02-04 MED ORDER — ONDANSETRON HCL 4 MG/2ML IJ SOLN
INTRAMUSCULAR | Status: DC | PRN
  Administered 2020-02-04: 4 mg via INTRAVENOUS

## 2020-02-04 MED ORDER — LACTATED RINGERS IV SOLN
INTRAVENOUS | Status: DC
  Administered 2020-02-04: 1000 mL via INTRAVENOUS

## 2020-02-04 MED ORDER — ONDANSETRON HCL 4 MG/2ML IJ SOLN
INTRAMUSCULAR | Status: AC
  Filled 2020-02-04: qty 2

## 2020-02-04 MED ORDER — TRAMADOL HCL 50 MG PO TABS: 50.0000 mg | ORAL_TABLET | Freq: Four times a day (QID) | ORAL | 0 refills | Status: DC | PRN

## 2020-02-04 MED ORDER — DEXAMETHASONE SODIUM PHOSPHATE 10 MG/ML IJ SOLN
INTRAMUSCULAR | Status: AC
  Filled 2020-02-04: qty 1

## 2020-02-04 MED ORDER — ACETAMINOPHEN 500 MG PO TABS
1000.0000 mg | ORAL_TABLET | Freq: Once | ORAL | Status: AC
  Administered 2020-02-04: 1000 mg via ORAL
  Filled 2020-02-04: qty 2

## 2020-02-04 MED ORDER — SCOPOLAMINE 1 MG/3DAYS TD PT72
1.0000 | MEDICATED_PATCH | TRANSDERMAL | Status: DC
  Filled 2020-02-04: qty 1

## 2020-02-04 MED ORDER — FENTANYL CITRATE (PF) 100 MCG/2ML IJ SOLN
INTRAMUSCULAR | Status: DC | PRN
  Administered 2020-02-04: 100 ug via INTRAVENOUS

## 2020-02-04 MED ORDER — CHLORHEXIDINE GLUCONATE 4 % EX LIQD: 60.0000 mL | Freq: Once | CUTANEOUS | Status: DC

## 2020-02-04 MED ORDER — BUPIVACAINE HCL (PF) 0.25 % IJ SOLN
INTRAMUSCULAR | Status: DC | PRN
  Administered 2020-02-04: 20 mL

## 2020-02-04 MED ORDER — PROMETHAZINE HCL 25 MG/ML IJ SOLN: 6.2500 mg | INTRAMUSCULAR | Status: DC | PRN

## 2020-02-04 MED ORDER — ORAL CARE MOUTH RINSE: 15.0000 mL | Freq: Once | OROMUCOSAL | Status: AC

## 2020-02-04 MED ORDER — MIDAZOLAM HCL 5 MG/5ML IJ SOLN
INTRAMUSCULAR | Status: DC | PRN
  Administered 2020-02-04: 2 mg via INTRAVENOUS

## 2020-02-04 MED ORDER — FENTANYL CITRATE (PF) 100 MCG/2ML IJ SOLN
INTRAMUSCULAR | Status: AC
  Filled 2020-02-04: qty 2

## 2020-02-04 MED ORDER — LIDOCAINE 2% (20 MG/ML) 5 ML SYRINGE
INTRAMUSCULAR | Status: DC | PRN
  Administered 2020-02-04: 100 mg via INTRAVENOUS

## 2020-02-04 MED ORDER — PROPOFOL 10 MG/ML IV BOLUS
INTRAVENOUS | Status: DC | PRN
  Administered 2020-02-04: 170 mg via INTRAVENOUS

## 2020-02-04 MED ORDER — FENTANYL CITRATE (PF) 100 MCG/2ML IJ SOLN
25.0000 ug | INTRAMUSCULAR | Status: DC | PRN
  Administered 2020-02-04 (×2): 50 ug via INTRAVENOUS

## 2020-02-04 MED ORDER — EPHEDRINE SULFATE-NACL 50-0.9 MG/10ML-% IV SOSY
PREFILLED_SYRINGE | INTRAVENOUS | Status: DC | PRN
  Administered 2020-02-04: 10 mg via INTRAVENOUS
  Administered 2020-02-04: 20 mg via INTRAVENOUS
  Administered 2020-02-04 (×2): 10 mg via INTRAVENOUS

## 2020-02-04 MED ORDER — ACETAMINOPHEN 500 MG PO TABS: 1000.0000 mg | ORAL_TABLET | ORAL | Status: DC

## 2020-02-04 MED ORDER — MIDAZOLAM HCL 2 MG/2ML IJ SOLN
INTRAMUSCULAR | Status: AC
  Filled 2020-02-04: qty 2

## 2020-02-04 MED ORDER — DEXAMETHASONE SODIUM PHOSPHATE 10 MG/ML IJ SOLN
INTRAMUSCULAR | Status: DC | PRN
  Administered 2020-02-04: 10 mg via INTRAVENOUS

## 2020-02-04 MED ORDER — PROPOFOL 10 MG/ML IV BOLUS
INTRAVENOUS | Status: AC
  Filled 2020-02-04: qty 20

## 2020-02-04 MED ORDER — BUPIVACAINE HCL 0.25 % IJ SOLN
INTRAMUSCULAR | Status: AC
  Filled 2020-02-04: qty 1

## 2020-02-04 MED ORDER — BUPIVACAINE LIPOSOME 1.3 % IJ SUSP
20.0000 mL | Freq: Once | INTRAMUSCULAR | Status: AC
  Administered 2020-02-04: 20 mL
  Filled 2020-02-04: qty 20

## 2020-02-04 MED ORDER — 0.9 % SODIUM CHLORIDE (POUR BTL) OPTIME
TOPICAL | Status: DC | PRN
  Administered 2020-02-04: 1000 mL

## 2020-02-04 MED ORDER — PHENYLEPHRINE 40 MCG/ML (10ML) SYRINGE FOR IV PUSH (FOR BLOOD PRESSURE SUPPORT)
PREFILLED_SYRINGE | INTRAVENOUS | Status: DC | PRN
  Administered 2020-02-04 (×4): 80 ug via INTRAVENOUS

## 2020-02-04 MED ORDER — CEFAZOLIN SODIUM-DEXTROSE 2-4 GM/100ML-% IV SOLN
2.0000 g | INTRAVENOUS | Status: AC
  Administered 2020-02-04: 2 g via INTRAVENOUS
  Filled 2020-02-04: qty 100

## 2020-02-04 MED ORDER — PHENYLEPHRINE 40 MCG/ML (10ML) SYRINGE FOR IV PUSH (FOR BLOOD PRESSURE SUPPORT)
PREFILLED_SYRINGE | INTRAVENOUS | Status: AC
  Filled 2020-02-04: qty 10

## 2020-02-04 MED ORDER — EPHEDRINE 5 MG/ML INJ
INTRAVENOUS | Status: AC
  Filled 2020-02-04: qty 10

## 2020-02-04 MED ORDER — CHLORHEXIDINE GLUCONATE 0.12 % MT SOLN
15.0000 mL | Freq: Once | OROMUCOSAL | Status: AC
  Administered 2020-02-04: 15 mL via OROMUCOSAL

## 2020-02-04 SURGICAL SUPPLY — 42 items
ADH SKN CLS APL DERMABOND .7 (GAUZE/BANDAGES/DRESSINGS) ×1
APL SKNCLS STERI-STRIP NONHPOA (GAUZE/BANDAGES/DRESSINGS) ×1
BENZOIN TINCTURE PRP APPL 2/3 (GAUZE/BANDAGES/DRESSINGS) ×2 IMPLANT
BLADE SURG SZ10 CARB STEEL (BLADE) ×2 IMPLANT
COVER SURGICAL LIGHT HANDLE (MISCELLANEOUS) ×2 IMPLANT
COVER WAND RF STERILE (DRAPES) IMPLANT
DECANTER SPIKE VIAL GLASS SM (MISCELLANEOUS) ×2 IMPLANT
DERMABOND ADVANCED (GAUZE/BANDAGES/DRESSINGS) ×1
DERMABOND ADVANCED .7 DNX12 (GAUZE/BANDAGES/DRESSINGS) IMPLANT
DISSECTOR ROUND CHERRY 3/8 STR (MISCELLANEOUS) ×1 IMPLANT
DRAIN PENROSE 0.5X18 (DRAIN) ×2 IMPLANT
DRAPE LAPAROTOMY TRNSV 102X78 (DRAPES) ×2 IMPLANT
ELECT REM PT RETURN 15FT ADLT (MISCELLANEOUS) ×2 IMPLANT
GAUZE SPONGE 4X4 12PLY STRL (GAUZE/BANDAGES/DRESSINGS) ×2 IMPLANT
GLOVE BIOGEL PI IND STRL 7.0 (GLOVE) ×1 IMPLANT
GLOVE BIOGEL PI INDICATOR 7.0 (GLOVE) ×1
GLOVE SURG SYN 7.5  E (GLOVE) ×2
GLOVE SURG SYN 7.5 E (GLOVE) ×1 IMPLANT
GLOVE SURG SYN 7.5 PF PI (GLOVE) ×1 IMPLANT
GOWN SPEC L4 XLG W/TWL (GOWN DISPOSABLE) ×2 IMPLANT
GOWN STRL REUS W/TWL LRG LVL3 (GOWN DISPOSABLE) ×2 IMPLANT
KIT BASIN (CUSTOM PROCEDURE TRAY) ×2 IMPLANT
KIT TURNOVER KIT A (KITS) IMPLANT
MESH ULTRAPRO 3X6 7.6X15CM (Mesh General) ×1 IMPLANT
NDL HYPO 25X1 1.5 SAFETY (NEEDLE) ×1 IMPLANT
NEEDLE HYPO 25X1 1.5 SAFETY (NEEDLE) ×2 IMPLANT
NS IRRIG 1000ML POUR BTL (IV SOLUTION) ×2 IMPLANT
PACK BASIC VI WITH GOWN DISP (CUSTOM PROCEDURE TRAY) ×2 IMPLANT
PENCIL SMOKE EVACUATOR (MISCELLANEOUS) ×1 IMPLANT
SPONGE LAP 18X18 RF (DISPOSABLE) ×2 IMPLANT
SPONGE LAP 4X18 RFD (DISPOSABLE) IMPLANT
STRIP CLOSURE SKIN 1/2X4 (GAUZE/BANDAGES/DRESSINGS) IMPLANT
SUT CHROMIC 0 SH (SUTURE) IMPLANT
SUT MNCRL AB 4-0 PS2 18 (SUTURE) IMPLANT
SUT NOVA 0 T19/GS 22DT (SUTURE) ×5 IMPLANT
SUT VIC AB 2-0 SH 27 (SUTURE) ×4
SUT VIC AB 2-0 SH 27X BRD (SUTURE) ×1 IMPLANT
SUT VIC AB 3-0 SH 18 (SUTURE) ×2 IMPLANT
SYR BULB IRRIG 60ML STRL (SYRINGE) ×2 IMPLANT
SYR CONTROL 10ML LL (SYRINGE) ×2 IMPLANT
TOWEL OR 17X26 10 PK STRL BLUE (TOWEL DISPOSABLE) ×2 IMPLANT
YANKAUER SUCT BULB TIP 10FT TU (MISCELLANEOUS) ×2 IMPLANT

## 2020-02-04 NOTE — Interval H&P Note (Signed)
History and Physical Interval Note:  02/04/2020 12:13 PM  Kristi Jones  has presented today for surgery, with the diagnosis of RIGHT INGUINAL HERNIA.  The various methods of treatment have been discussed with the patient and family.   Her husband is available post op.  After consideration of risks, benefits and other options for treatment, the patient has consented to  Procedure(s): OPEN RIGHT INGUINAL HERNIA REPAIR (Right) as a surgical intervention.  The patient's history has been reviewed, patient examined, no change in status, stable for surgery.  I have reviewed the patient's chart and labs.  Questions were answered to the patient's satisfaction.     Shann Medal

## 2020-02-04 NOTE — Transfer of Care (Signed)
Immediate Anesthesia Transfer of Care Note  Patient: Kristi Jones  Procedure(s) Performed: OPEN RIGHT INGUINAL HERNIA REPAIR (Right )  Patient Location: PACU  Anesthesia Type:General  Level of Consciousness: sedated  Airway & Oxygen Therapy: Patient Spontanous Breathing and Patient connected to face mask oxygen  Post-op Assessment: Report given to RN and Post -op Vital signs reviewed and stable  Post vital signs: Reviewed and stable  Last Vitals:  Vitals Value Taken Time  BP 144/71 02/04/20 1418  Temp    Pulse 100 02/04/20 1422  Resp 18 02/04/20 1422  SpO2 100 % 02/04/20 1422  Vitals shown include unvalidated device data.  Last Pain:  Vitals:   02/04/20 1124  TempSrc:   PainSc: 1       Patients Stated Pain Goal: 4 (64/38/38 1840)  Complications: No complications documented.

## 2020-02-04 NOTE — Op Note (Signed)
02/04/2020  2:13 PM  PATIENT:  Kristi Jones, 74 y.o., female, MRN: [id number]  PREOP DIAGNOSIS:  RIGHT INGUINAL/femoral HERNIA  POSTOP DIAGNOSIS:   Incarcerated right femoral hernia  PROCEDURE:   Procedure(s):  OPEN RIGHT INGUINAL HERNIA REPAIR  SURGEON:   Alphonsa Overall, M.D.  ANESTHESIA:   general  Anesthesiologist: Duane Boston, MD CRNA: Talbot Grumbling, CRNA; West Pugh, CRNA  General  EBL:  <75  ml  LOCAL MEDICATIONS USED:   20 cc Exparel and 20 cc of 1/4% marcaine  SPECIMEN:   None  COUNTS CORRECT:  YES  INDICATIONS FOR PROCEDURE:  [name] is a 74 y.o. (DOB: August 30, 1945) white female whose primary care physician is Leamon Arnt, MD and comes for repair of right inguinal/femoral hernia.   The indications and risks of the hernia surgery were explained to the patient.  The risks include, but are not limited to, infection, bleeding, recurrence of the hernia, and nerve injury.  Operative Note: The patient was taken to room number 1 at [facility].  She underwent a general anesthesia.    A time out was held and the surgical checklist run.  Her lower abdomen was shaved and then prepped with chloroprep.  A right inguinal incision was made through the subcutaneous fat to the external oblique fascia.  Her hernia lay below the ileo-inguinal ligament, consistent with a right femoral hernia.  I was able to reduce about 50% of the hernia, but had to open up the area under the right ileoinguinal ligament to reduce the entire hernia.  I traced the round ligament to the pubic bone and removed it.  The round ligament was ligated with a 2-0 vicryl.  I saw no evidence of indirect or direct inguinal hernia.  The inguinal floor was repaired with a 3 x 6 inch piece of Ultrapro mesh.  The mesh was cut to fit the inguinal floor.  The mesh was sewn in place with interrupted 0 Novafil suture.  I sewed the mesh medially to the pubic tubercle, inferiorly to the  shelving edge of the inguinal ligament and Cooper's ligament, and superiorly to the transversalis fascia.   I tried to make sure that inferior sutures were in Cooper's ligament and covered the femoral defect.  The mesh lay flat.  The inguinal floor was covered and the internal ring recreated.  The external oblique was closed with a 3-0 vicryl.  The fascia and subcutaneous tissues were infiltrated with 40 cc of a mixture of local anesthetic.  The skin was closed with 4-0 monocryl and painted with DermaBond. The sponge and needle count were correct at the end of the case.  The patient was transported to the recovery room in good condition.  The patient will go home today.  Discharge instructions reviewed.  Alphonsa Overall, MD, [facility] Pager: [phone] Office phone:  [phone]

## 2020-02-04 NOTE — Anesthesia Postprocedure Evaluation (Signed)
Anesthesia Post Note  Patient: Kristi Jones  Procedure(s) Performed: OPEN RIGHT INGUINAL HERNIA REPAIR (Right )     Patient location during evaluation: PACU Anesthesia Type: General Level of consciousness: sedated Pain management: pain level controlled Vital Signs Assessment: post-procedure vital signs reviewed and stable Respiratory status: spontaneous breathing and respiratory function stable Cardiovascular status: stable Postop Assessment: no apparent nausea or vomiting Anesthetic complications: no   No complications documented.  Last Vitals:  Vitals:   02/04/20 1530 02/04/20 1545  BP:    Pulse:    Resp:    Temp: 36.6 C 36.6 C  SpO2:      Last Pain:  Vitals:   02/04/20 1530  TempSrc:   PainSc: Asleep                 Caydee Talkington DANIEL

## 2020-02-04 NOTE — Discharge Instructions (Signed)
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  A note for work given to patient.  Activity:  Driving - May drive in 2 to 4 days, if doing well   Lifting - No lifting more than 15 pounds for 4 weeks.                       Practice your Covid-19 protection:  Wear a mask, social distance, and wash your hands frequently  Wound Care:   Leave the incision dry for 2 days, then you may shower  Diet:  As tolerated  Follow up appointment:  Call Dr. Pollie Friar office [facility]) at [phone] for an appointment in 2 -3 weeks..  Medications and dosages:  Resume your home medications.  You have a prescription for:  Ultram             You may also take Tylenol, ibuprofen, or Aleve for pain  Call Dr. Lucia Gaskins or his office  [phone]) if you have:  Temperature greater than 100.4,  Persistent nausea and vomiting,  Severe uncontrolled pain,  Redness, tenderness, or signs of infection (pain, swelling, redness, odor or green/yellow discharge around the site),  Difficulty breathing, headache or visual disturbances,  Any other questions or concerns you may have after discharge.  In an emergency, call 911 or go to an Emergency Department at a nearby hospital.

## 2020-02-04 NOTE — Anesthesia Procedure Notes (Signed)
Procedure Name: LMA Insertion Date/Time: 02/04/2020 12:35 PM Performed by: Talbot Grumbling, CRNA Pre-anesthesia Checklist: Patient identified, Emergency Drugs available, Suction available and Patient being monitored Patient Re-evaluated:Patient Re-evaluated prior to induction Oxygen Delivery Method: Circle system utilized Preoxygenation: Pre-oxygenation with 100% oxygen Induction Type: IV induction Ventilation: Mask ventilation without difficulty LMA: LMA inserted LMA Size: 4.0 Number of attempts: 1 Placement Confirmation: positive ETCO2 and breath sounds checked- equal and bilateral Tube secured with: Tape Dental Injury: Teeth and Oropharynx as per pre-operative assessment

## 2020-02-04 NOTE — Anesthesia Preprocedure Evaluation (Addendum)
Anesthesia Evaluation  Patient identified by MRN, date of birth, ID band Patient awake    Reviewed: Allergy & Precautions, NPO status , Patient's Chart, lab work & pertinent test results  History of Anesthesia Complications Negative for: history of anesthetic complications  Airway Mallampati: II  TM Distance: >3 FB Neck ROM: Full    Dental no notable dental hx. (+) Dental Advisory Given   Pulmonary neg pulmonary ROS,    Pulmonary exam normal        Cardiovascular hypertension, Pt. on medications Normal cardiovascular exam     Neuro/Psych negative neurological ROS     GI/Hepatic negative GI ROS, Neg liver ROS,   Endo/Other  Hypothyroidism   Renal/GU negative Renal ROS     Musculoskeletal negative musculoskeletal ROS (+)   Abdominal   Peds  Hematology negative hematology ROS (+)   Anesthesia Other Findings   Reproductive/Obstetrics                            Anesthesia Physical Anesthesia Plan  ASA: II  Anesthesia Plan: General   Post-op Pain Management:    Induction: Intravenous  PONV Risk Score and Plan: 4 or greater and Ondansetron, Dexamethasone, Midazolam and Scopolamine patch - Pre-op  Airway Management Planned: LMA and Oral ETT  Additional Equipment:   Intra-op Plan:   Post-operative Plan: Extubation in OR  Informed Consent: I have reviewed the patients History and Physical, chart, labs and discussed the procedure including the risks, benefits and alternatives for the proposed anesthesia with the patient or authorized representative who has indicated his/her understanding and acceptance.     Dental advisory given  Plan Discussed with: Anesthesiologist and CRNA  Anesthesia Plan Comments:        Anesthesia Quick Evaluation

## 2020-02-05 ENCOUNTER — Encounter (HOSPITAL_COMMUNITY): Payer: Self-pay | Admitting: Surgery

## 2020-02-10 ENCOUNTER — Encounter: Payer: Self-pay | Admitting: Family Medicine

## 2020-04-14 DIAGNOSIS — Z20822 Contact with and (suspected) exposure to covid-19: Secondary | ICD-10-CM | POA: Diagnosis not present

## 2020-05-06 DIAGNOSIS — Z20822 Contact with and (suspected) exposure to covid-19: Secondary | ICD-10-CM | POA: Diagnosis not present

## 2020-05-08 ENCOUNTER — Encounter: Payer: Self-pay | Admitting: Family Medicine

## 2020-05-09 ENCOUNTER — Other Ambulatory Visit: Payer: Self-pay

## 2020-05-09 ENCOUNTER — Ambulatory Visit (INDEPENDENT_AMBULATORY_CARE_PROVIDER_SITE_OTHER): Payer: Medicare Other | Admitting: Family Medicine

## 2020-05-09 ENCOUNTER — Encounter: Payer: Self-pay | Admitting: Family Medicine

## 2020-05-09 VITALS — BP 122/80 | HR 69 | Temp 98.1°F | Resp 16 | Ht 65.0 in | Wt 131.6 lb

## 2020-05-09 DIAGNOSIS — E782 Mixed hyperlipidemia: Secondary | ICD-10-CM | POA: Diagnosis not present

## 2020-05-09 DIAGNOSIS — E039 Hypothyroidism, unspecified: Secondary | ICD-10-CM | POA: Diagnosis not present

## 2020-05-09 DIAGNOSIS — D734 Cyst of spleen: Secondary | ICD-10-CM | POA: Insufficient documentation

## 2020-05-09 DIAGNOSIS — I10 Essential (primary) hypertension: Secondary | ICD-10-CM | POA: Diagnosis not present

## 2020-05-09 DIAGNOSIS — M810 Age-related osteoporosis without current pathological fracture: Secondary | ICD-10-CM | POA: Diagnosis not present

## 2020-05-09 NOTE — Patient Instructions (Addendum)
Please return in 12 months for your annual complete physical; please come fasting and blood pressure/thyroid follow up.   I will release your lab results to you on your MyChart account with further instructions. Please reply with any questions.   Great seeing you, as always!!  And I didn't combine your losartan and hctz due to the dosages. We will keep them the same for now.   If you have any questions or concerns, please don't hesitate to send me a message via MyChart or call the office at [phone]. Thank you for visiting with [place] today! It's our pleasure caring for you.

## 2020-05-09 NOTE — Progress Notes (Signed)
Subjective  Chief Complaint  Patient presents with  . Annual Exam    3 oz. cranberry juice this morning     HPI: Kristi Jones is a 74 y.o. female who presents to [place] at [place] today for a Female Wellness Visit. She also has the concerns and/or needs as listed above in the chief complaint. These will be addressed in addition to the Health Maintenance Visit.   Wellness Visit: annual visit with health maintenance review and exam without Pap   HM: up to date on screens. Feels great. Just came back from 2 weeks in [place]. Had a fabulous time. Refuses pneumonia vaccines.  Chronic disease f/u and/or acute problem visit: (deemed necessary to be done in addition to the wellness visit):  HTN. Well controlled. No concerns. Feeling well. Taking medications w/o adverse effects. No symptoms of CHF, angina; no palpitations, sob, cp or lower extremity edema. Compliant with meds.   Discussed CT scan 01/2020 (inguinal hernia eval): showing colonic thickening and splenic cysts: Patient denies any symptoms of abdominal pain, diarrhea, mucoid stool or history of diverticulitis.  She has no history of cancer.  She did reviews these findings with Dr. Jana Hakim, oncology who did not recommend follow-up MRI imaging.  They are consistent with benign splenic cyst.  Hyperlipidemia tolerating statin.  No adverse effects.  Due for nonfasting lipid panel  Hypothyroidism which has been well controlled.  Takes supplements daily.  Energy level is great.  No symptoms of high or low thyroid.  Osteoporosis: Has been on Fosamax in 2017: Most recent scan was mildly improved.  Gets calcium and vitamin D in her diet.  Assessment  1. HTN (hypertension), benign   2. Splenic cyst   3. Mixed hyperlipidemia   4. Acquired hypothyroidism   5. Age-related osteoporosis without current pathological fracture      Plan  Female Wellness Visit:  Age appropriate Health Maintenance and Prevention measures  were discussed with patient. Included topics are cancer screening recommendations, ways to keep healthy (see AVS) including dietary and exercise recommendations, regular eye and dental care, use of seat belts, and avoidance of moderate alcohol use and tobacco use.  Screens are up-to-date.  We will repeat mammogram and bone density in July next year  BMI: discussed patient's BMI and encouraged positive lifestyle modifications to help get to or maintain a target BMI.  HM needs and immunizations were addressed and ordered. See below for orders. See HM and immunization section for updates.  Will get flu vaccine at work  Routine labs and screening tests ordered including cmp, cbc and lipids where appropriate.  Discussed recommendations regarding Vit D and calcium supplementation (see AVS)  Chronic disease management visit and/or acute problem visit:  Osteoporosis: Continue Fosamax for 5 years then can take drug holiday.  Recheck DEXA next year.  Splenic cyst: Likely congenital and benign.  No follow-up recommended per your oncology  Hypertension and hyperlipidemia have been well controlled.  Continue current medications and check lab work.  Hypothyroidism: Clinically euthyroid, recheck level today.  S/p right inguinal hernia repair, recovered well  Follow up: 12 months for physical and follow-up hypothyroidism and hypertension Orders Placed This Encounter  Procedures  . CBC with Differential/Platelet  . COMPLETE METABOLIC PANEL WITH GFR  . Lipid panel  . TSH   No orders of the defined types were placed in this encounter.     Lifestyle: Body mass index is 21.9 kg/m. Wt Readings from Last 3 Encounters:  05/09/20  131 lb 9.6 oz (59.7 kg)  02/04/20 135 lb (61.2 kg)  02/01/20 135 lb (61.2 kg)     Patient Active Problem List   Diagnosis Date Noted  . Hyperlipidemia 05/10/2014    Priority: High    Failed simvastatin due to myalgias   . HTN (hypertension), benign 10/31/2013     Priority: High  . Hypothyroidism 10/31/2013    Priority: High  . Breast fibroadenoma, left 11/08/2014    Priority: Medium    Left subareolar.  Status post biopsy   . Age-related osteoporosis without current pathological fracture 10/31/2013    Priority: Medium    DEXA 01/2013 - T - -2.3 with elevated FRAX of 20.9%; rec consideration of fosamax - pt declines now 04/2014/cla DEXA 11/2014 T= - 2.5 with elevated FRZS of 23% and elevated hip frac 4% - Fosamax started/cla DEXA 01/2017: T=-2.2, lowest; 4% improved in femur, 11% improvement in spine on fosamax/ 12/2017 DEXA 01/2019: R femur neck T-Score: -2.20 lowest on fosamax. Stable on meds. Continue 02/2019/cla    . Splenic cyst 05/09/2020    Priority: Low    On CT scan 01/2020: pt reviewed with Dr. Jana Hakim (oncoloyg) who did NOT recommend rechecking with f/u MRI. Pt agrees.   . Sebaceous cyst, upper back 06/15/2012    Priority: Low  . Pneumococcal vaccine refused 01/24/2019   Health Maintenance  Topic Date Due  . TETANUS/TDAP  11/08/2020  . DEXA SCAN  02/20/2021  . MAMMOGRAM  02/26/2021  . COLONOSCOPY  06/16/2022  . INFLUENZA VACCINE  Completed  . COVID-19 Vaccine  Completed  . Hepatitis C Screening  Completed  . PNA vac Low Risk Adult  Discontinued   Immunization History  Administered Date(s) Administered  . Influenza, High Dose Seasonal PF 06/04/2015  . Influenza,inj,Quad PF,6+ Mos 05/07/2020  . Moderna SARS-COVID-2 Vaccination 09/05/2019, 10/04/2019  . Tdap 11/09/2010   We updated and reviewed the patient's past history in detail and it is documented below. Allergies: Patient is allergic to sulfa antibiotics, sulfa drugs cross reactors, simvastatin, penicillins, and lisinopril. Past Medical History Patient  has a past medical history of Arthritis, Hepatitis, Hyperlipemia, Hypertension, Hypothyroidism, Osteopenia (10/31/2013), and Thyroid disease. Past Surgical History Patient  has a past surgical history that includes I & D  extremity (01/19/2012); Appendectomy; Hand surgery (2013); Vaginal hysterectomy; Tonsillectomy and adenoidectomy; laparotomy; and Inguinal hernia repair (Right, 02/04/2020). Family History: Patient family history includes Alzheimer's disease in her mother; Breast cancer in her sister; Diabetes in her mother; Heart disease in her father and mother; Hyperlipidemia in her brother; Hypertension in her mother. Social History:  Patient  reports that she has never smoked. She has never used smokeless tobacco. She reports current alcohol use. She reports that she does not use drugs.  Review of Systems: Constitutional: negative for fever or malaise Ophthalmic: negative for photophobia, double vision or loss of vision Cardiovascular: negative for chest pain, dyspnea on exertion, or new LE swelling Respiratory: negative for SOB or persistent cough Gastrointestinal: negative for abdominal pain, change in bowel habits or melena Genitourinary: negative for dysuria or gross hematuria, no abnormal uterine bleeding or disharge Musculoskeletal: negative for new gait disturbance or muscular weakness Integumentary: negative for new or persistent rashes, no breast lumps Neurological: negative for TIA or stroke symptoms Psychiatric: negative for SI or delusions Allergic/Immunologic: negative for hives  Patient Care Team    Relationship Specialty Notifications Start End  Leamon Arnt, MD PCP - General Family Medicine  01/10/18   Dermatology, [place]ora  09/28/18   Delsa Sale, OD Consulting Physician Optometry  10/05/19     Objective  Vitals: BP 122/80   Pulse 69   Temp 98.1 F (36.7 C) (Temporal)   Resp 16   Ht 5\' 5"  (1.651 m)   Wt 131 lb 9.6 oz (59.7 kg)   SpO2 99%   BMI 21.90 kg/m  General:  Well developed, well nourished, no acute distress  Psych:  Alert and orientedx3,normal mood and affect HEENT:  Normocephalic, atraumatic, non-icteric sclera,  supple neck without adenopathy, mass or  thyromegaly Cardiovascular:  Normal S1, S2, RRR without gallop, rub or murmur Respiratory:  Good breath sounds bilaterally, CTAB with normal respiratory effort Gastrointestinal: normal bowel sounds, soft, non-tender, no noted masses. No HSM MSK: no deformities, contusions. Joints are without erythema or swelling.  Skin:  Warm, no rashes or suspicious lesions noted Neurologic:    Mental status is normal.Gross motor and sensory exams are normal. Normal gait. No tremor Breast Exam: Patient declined breast exam today   Commons side effects, risks, benefits, and alternatives for medications and treatment plan prescribed today were discussed, and the patient expressed understanding of the given instructions. Patient is instructed to call or message via MyChart if he/she has any questions or concerns regarding our treatment plan. No barriers to understanding were identified. We discussed Red Flag symptoms and signs in detail. Patient expressed understanding regarding what to do in case of urgent or emergency type symptoms.   Medication list was reconciled, printed and provided to the patient in AVS. Patient instructions and summary information was reviewed with the patient as documented in the AVS. This note was prepared with assistance of Dragon voice recognition software. Occasional wrong-word or sound-a-like substitutions may have occurred due to the inherent limitations of voice recognition software  This visit occurred during the SARS-CoV-2 public health emergency.  Safety protocols were in place, including screening questions prior to the visit, additional usage of staff PPE, and extensive cleaning of exam room while observing appropriate contact time as indicated for disinfecting solutions.

## 2020-05-10 LAB — CBC WITH DIFFERENTIAL/PLATELET
Absolute Monocytes: 431 cells/uL (ref 200–950)
Basophils Absolute: 37 cells/uL (ref 0–200)
Basophils Relative: 0.5 %
Eosinophils Absolute: 110 cells/uL (ref 15–500)
Eosinophils Relative: 1.5 %
HCT: 40.8 % (ref 35.0–45.0)
Hemoglobin: 13.6 g/dL (ref 11.7–15.5)
Lymphs Abs: 1445 cells/uL (ref 850–3900)
MCH: 29.8 pg (ref 27.0–33.0)
MCHC: 33.3 g/dL (ref 32.0–36.0)
MCV: 89.3 fL (ref 80.0–100.0)
MPV: 11.7 fL (ref 7.5–12.5)
Monocytes Relative: 5.9 %
Neutro Abs: 5278 cells/uL (ref 1500–7800)
Neutrophils Relative %: 72.3 %
Platelets: 216 10*3/uL (ref 140–400)
RBC: 4.57 10*6/uL (ref 3.80–5.10)
RDW: 12.9 % (ref 11.0–15.0)
Total Lymphocyte: 19.8 %
WBC: 7.3 10*3/uL (ref 3.8–10.8)

## 2020-05-10 LAB — COMPLETE METABOLIC PANEL WITH GFR
AG Ratio: 1.8 (calc) (ref 1.0–2.5)
ALT: 10 U/L (ref 6–29)
AST: 15 U/L (ref 10–35)
Albumin: 4.4 g/dL (ref 3.6–5.1)
Alkaline phosphatase (APISO): 54 U/L (ref 37–153)
BUN: 24 mg/dL (ref 7–25)
CO2: 30 mmol/L (ref 20–32)
Calcium: 9.6 mg/dL (ref 8.6–10.4)
Chloride: 101 mmol/L (ref 98–110)
Creat: 0.81 mg/dL (ref 0.60–0.93)
GFR, Est African American: 84 mL/min/{1.73_m2} (ref >=60)
GFR, Est Non African American: 72 mL/min/{1.73_m2} (ref >=60)
Globulin: 2.4 g/dL (calc) (ref 1.9–3.7)
Glucose, Bld: 106 mg/dL — ABNORMAL HIGH (ref 65–99)
Potassium: 3.6 mmol/L (ref 3.5–5.3)
Sodium: 140 mmol/L (ref 135–146)
Total Bilirubin: 0.6 mg/dL (ref 0.2–1.2)
Total Protein: 6.8 g/dL (ref 6.1–8.1)

## 2020-05-10 LAB — LIPID PANEL
Cholesterol: 164 mg/dL (ref <200)
HDL: 71 mg/dL (ref >=50)
LDL Cholesterol (Calc): 75 mg/dL (calc)
Non-HDL Cholesterol (Calc): 93 mg/dL (calc) (ref <130)
Total CHOL/HDL Ratio: 2.3 (calc) (ref <5.0)
Triglycerides: 99 mg/dL (ref <150)

## 2020-05-10 LAB — TSH: TSH: 0.71 mIU/L (ref 0.40–4.50)

## 2020-05-11 ENCOUNTER — Other Ambulatory Visit: Payer: Self-pay | Admitting: Family Medicine

## 2020-06-16 ENCOUNTER — Encounter: Payer: Self-pay | Admitting: Family Medicine

## 2020-08-03 ENCOUNTER — Other Ambulatory Visit: Payer: Self-pay | Admitting: Family Medicine

## 2020-08-27 ENCOUNTER — Other Ambulatory Visit: Payer: Self-pay

## 2020-08-27 ENCOUNTER — Encounter: Payer: Self-pay | Admitting: Family Medicine

## 2020-08-27 ENCOUNTER — Telehealth (INDEPENDENT_AMBULATORY_CARE_PROVIDER_SITE_OTHER): Payer: Medicare Other | Admitting: Family Medicine

## 2020-08-27 DIAGNOSIS — S39012A Strain of muscle, fascia and tendon of lower back, initial encounter: Secondary | ICD-10-CM | POA: Diagnosis not present

## 2020-08-27 MED ORDER — METHOCARBAMOL 500 MG PO TABS: 500.0000 mg | ORAL_TABLET | Freq: Three times a day (TID) | ORAL | 0 refills | Status: DC | PRN

## 2020-08-27 NOTE — Progress Notes (Signed)
Virtual Visit via Video Note  Subjective  CC:  Chief Complaint  Patient presents with  . Back Pain    Injured at yoga on Monday night, right lower side      I connected with [name] on 08/27/20 at  4:00 PM EST by a video enabled telemedicine application and verified that I am speaking with the correct person using two identifiers. Location patient: Home Location provider: [facility] [place], Office Persons participating in the virtual visit: Kristi Jones, Leamon Arnt, MD Reymundo Poll CMA  I discussed the limitations of evaluation and management by telemedicine and the availability of in person appointments. The patient expressed understanding and agreed to proceed. HPI: Kristi Jones is a 75 y.o. female who was contacted today to address the problems listed above in the chief complaint. . 75 year old who was participating in yoga 2 nights ago.  During one of the stretching exercises she experienced significant stretch over the right upper glutes.  She had no other problems at night however the next morning she had difficulty getting out of bed due to the pain and spasm.  Since she has intermittent symptoms mostly related to certain position changes or if she is seated for prolonged period.  She describes pain in the right posterior iliac crest.  It seems to be muscular in nature.  No radicular symptoms.  No midline back pain.  She can move well with normal gait and normal activities.  She does have a some symptoms with bending forward.  No lower extremity pain or weakness.  She has been using Advil and this is helped her pain significantly.  She is needing less active today than yesterday.  She is sleeping well.  No bowel or bladder symptoms.  No history of chronic back pain.   Assessment  1. Strain of lumbar region, initial encounter      Plan   Low back strain: With muscle spasm:start Muscle relaxer, stretching, heat and continue  Advil.  No red flag symptoms noted.  Follow-up if not improving I discussed the assessment and treatment plan with the patient. The patient was provided an opportunity to ask questions and all were answered. The patient agreed with the plan and demonstrated an understanding of the instructions.   The patient was advised to call back or seek an in-person evaluation if the symptoms worsen or if the condition fails to improve as anticipated. Follow up: prn  Visit date not found  Meds ordered this encounter  Medications  . methocarbamol (ROBAXIN) 500 MG tablet    Sig: Take 1 tablet (500 mg total) by mouth 3 (three) times daily as needed for muscle spasms.    Dispense:  20 tablet    Refill:  0      I reviewed the patients updated PMH, FH, and SocHx.    Patient Active Problem List   Diagnosis Date Noted  . Hyperlipidemia 05/10/2014    Priority: High  . HTN (hypertension), benign 10/31/2013    Priority: High  . Hypothyroidism 10/31/2013    Priority: High  . Breast fibroadenoma, left 11/08/2014    Priority: Medium  . Age-related osteoporosis without current pathological fracture 10/31/2013    Priority: Medium  . Splenic cyst 05/09/2020    Priority: Low  . Sebaceous cyst, upper back 06/15/2012    Priority: Low  . Pneumococcal vaccine refused 01/24/2019   Current Meds  Medication Sig  . alendronate (FOSAMAX) 70 MG  tablet TAKE 1 TAB ONCE A WEEK, AT LEAST 30 MIN BEFORE 1ST FOOD.DO NOT LIE DOWN FOR 30 MIN AFTER TAKING.  Marland Kitchen aspirin EC 81 MG tablet Take 81 mg by mouth daily.  . folic acid (FOLVITE) 1 MG tablet TAKE 1 TABLET ONCE DAILY.  . hydrochlorothiazide (HYDRODIURIL) 25 MG tablet Take 1 tablet (25 mg total) by mouth every morning.  Marland Kitchen levothyroxine (SYNTHROID) 88 MCG tablet Take 1 tablet (88 mcg total) by mouth daily.  Marland Kitchen losartan (COZAAR) 50 MG tablet TAKE 1 TABLET EACH DAY.  . methocarbamol (ROBAXIN) 500 MG tablet Take 1 tablet (500 mg total) by mouth 3 (three) times daily as needed  for muscle spasms.  . rosuvastatin (CRESTOR) 5 MG tablet TAKE ONE TABLET AT BEDTIME.    Allergies: Patient is allergic to sulfa antibiotics, sulfa drugs cross reactors, simvastatin, penicillins, and lisinopril. Family History: Patient family history includes Alzheimer's disease in her mother; Breast cancer in her sister; Diabetes in her mother; Heart disease in her father and mother; Hyperlipidemia in her brother; Hypertension in her mother. Social History:  Patient  reports that she has never smoked. She has never used smokeless tobacco. She reports current alcohol use. She reports that she does not use drugs.  Review of Systems: Constitutional: Negative for fever malaise or anorexia Cardiovascular: negative for chest pain Respiratory: negative for SOB or persistent cough Gastrointestinal: negative for abdominal pain  OBJECTIVE Vitals: There were no vitals taken for this visit. General: no acute distress , A&Ox3, gets up out of the chair easily.  Points to right low back when describing pain  Willow Ora, MD

## 2020-10-04 ENCOUNTER — Other Ambulatory Visit: Payer: Self-pay | Admitting: Family Medicine

## 2020-10-13 ENCOUNTER — Other Ambulatory Visit: Payer: Self-pay | Admitting: Family Medicine

## 2020-11-03 ENCOUNTER — Ambulatory Visit (INDEPENDENT_AMBULATORY_CARE_PROVIDER_SITE_OTHER): Payer: Medicare Other

## 2020-11-03 DIAGNOSIS — Z0000 Encounter for general adult medical examination without abnormal findings: Secondary | ICD-10-CM

## 2020-11-03 NOTE — Progress Notes (Signed)
Virtual Visit via Telephone Note  I connected with  Kristi Jones on 11/03/20 at 11:00 AM EDT by telephone and verified that I am speaking with the correct person using two identifiers.  Location: Patient: Home Provider: Office  Persons participating in the virtual visit: patient/Nurse Health Advisor   I discussed the limitations, risks, security and privacy concerns of performing an evaluation and management service by telephone and the availability of in person appointments. The patient expressed understanding and agreed to proceed.  Interactive audio and video telecommunications were attempted between this nurse and patient, however failed, due to patient having technical difficulties OR patient did not have access to video capability.  We continued and completed visit with audio only.  Some vital signs may be absent or patient reported.   Willette Brace, LPN    Subjective:   Kristi Jones is a 75 y.o. female who presents for Medicare Annual (Subsequent) preventive examination.  Review of Systems     Cardiac Risk Factors include: advanced age (>52men, >62 women);dyslipidemia;hypertension     Objective:    There were no vitals filed for this visit. There is no height or weight on file to calculate BMI.  Advanced Directives 11/03/2020 02/01/2020 01/29/2020 10/05/2019 09/28/2018  Does Patient Have a Medical Advance Directive? Yes No;Yes No Yes Yes  Type of Paramedic of [place];Living will - - Living will;Healthcare Power of [place];Living will  Does patient want to make changes to medical advance directive? - - - No - Patient declined -  Copy of [place] in Chart? Yes - validated most recent copy scanned in chart (See row information) - - Yes - validated most recent copy scanned in chart (See row information) No - copy requested  Would patient like information on creating a medical advance directive?  - - No - Guardian declined - -    Current Medications (verified) Outpatient Encounter Medications as of 11/03/2020  Medication Sig  . alendronate (FOSAMAX) 70 MG tablet TAKE 1 TAB ONCE A WEEK, AT LEAST 30 MIN BEFORE 1ST FOOD.DO NOT LIE DOWN FOR 30 MIN AFTER TAKING.  Marland Kitchen aspirin EC 81 MG tablet Take 81 mg by mouth daily.  . folic acid (FOLVITE) 1 MG tablet TAKE 1 TABLET ONCE DAILY.  . hydrochlorothiazide (HYDRODIURIL) 25 MG tablet TAKE 1 TABLET IN THE MORNING.  Marland Kitchen levothyroxine (SYNTHROID) 88 MCG tablet TAKE 1 TABLET ONCE DAILY.  Marland Kitchen losartan (COZAAR) 50 MG tablet TAKE 1 TABLET EACH DAY.  . rosuvastatin (CRESTOR) 5 MG tablet TAKE ONE TABLET AT BEDTIME.  . methocarbamol (ROBAXIN) 500 MG tablet Take 1 tablet (500 mg total) by mouth 3 (three) times daily as needed for muscle spasms. (Patient not taking: Reported on 11/03/2020)  . [DISCONTINUED] aspirin (ASPIRIN LOW DOSE) 81 MG EC tablet    No facility-administered encounter medications on file as of 11/03/2020.    Allergies (verified) Sulfa antibiotics, Sulfa drugs cross reactors, Simvastatin, Penicillins, and Lisinopril   History: Past Medical History:  Diagnosis Date  . Arthritis    hands   . Hepatitis    hx of Hep A as a child   . Hyperlipemia   . Hypertension   . Hypothyroidism   . Osteopenia 10/31/2013   DEXA 01/2013- T+ -2.3 with elvated FRAX of 20.9%, rec consideration of [place]- patient declines now 04/2014/cla  . Thyroid disease    Past Surgical History:  Procedure Laterality Date  . APPENDECTOMY    .  HAND SURGERY  2013   left hand due to trauma  . I & D EXTREMITY  01/19/2012   Procedure: IRRIGATION AND DEBRIDEMENT EXTREMITY;  Surgeon: Schuyler Amor, MD;  Location: [facility];  Service: Orthopedics;  Laterality: Left;  with common digital artery repair  . INGUINAL HERNIA REPAIR Right 02/04/2020   Procedure: OPEN RIGHT INGUINAL HERNIA REPAIR;  Surgeon: Alphonsa Overall, MD;  Location: [facility];  Service: General;  Laterality: Right;   . LAPAROTOMY     for endometriosis in 20s   . TONSILLECTOMY AND ADENOIDECTOMY    . VAGINAL HYSTERECTOMY     vaginal, partial, DUB    Family History  Problem Relation Age of Onset  . Alzheimer's disease Mother   . Diabetes Mother   . Heart disease Mother        CHF   . Hypertension Mother   . Heart disease Father   . Breast cancer Sister   . Hyperlipidemia Brother    Social History   Socioeconomic History  . Marital status: Married    Spouse name: Not on file  . Number of children: 3  . Years of education: 61  . Highest education level: Not on file  Occupational History  . 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    Employer: [employer]  Tobacco Use  . Smoking status: Never Smoker  . Smokeless tobacco: Never Used  Vaping Use  . Vaping Use: Never used  Substance and Sexual Activity  . Alcohol use: Yes    Comment: wine and beer occas   . Drug use: No  . Sexual activity: Yes  Other Topics Concern  . Not on file  Social History Narrative   Physically active with classes at gym    Social Determinants of Health   Financial Resource Strain: Low Risk   . Difficulty of Paying Living Expenses: Not hard at all  Food Insecurity: No Food Insecurity  . Worried About Charity fundraiser in the Last Year: Never true  . Ran Out of Food in the Last Year: Never true  Transportation Needs: No Transportation Needs  . Lack of Transportation (Medical): No  . Lack of Transportation (Non-Medical): No  Physical Activity: Sufficiently Active  . Days of Exercise per Week: 6 days  . Minutes of Exercise per Session: 60 min  Stress: No Stress Concern Present  . Feeling of Stress : Not at all  Social Connections: Moderately Integrated  . Frequency of Communication with Friends and Family: More than three times a week  . Frequency of Social Gatherings with Friends and Family: Three times a week  . Attends Religious Services: More than 4 times per year  . Active Member of Clubs or Organizations: No  .  Attends Archivist Meetings: Never  . Marital Status: Married    Tobacco Counseling Counseling given: Not Answered   Clinical Intake:  Pre-visit preparation completed: Yes  Pain : No/denies pain     BMI - recorded: 21.9 Nutritional Status: BMI of 19-24  Normal Nutritional Risks: None Diabetes: No  How often do you need to have someone help you when you read instructions, pamphlets, or other written materials from your doctor or pharmacy?: 1 - Never  Diabetic?No  Interpreter Needed?: No  Information entered by :: Charlott Rakes, LPN   Activities of Daily Living In your present state of health, do you have any difficulty performing the following activities: 11/03/2020 05/09/2020  Hearing? N N  Vision? N N  Difficulty concentrating or  making decisions? N N  Walking or climbing stairs? N N  Dressing or bathing? N N  Doing errands, shopping? N N  Preparing Food and eating ? N -  Using the Toilet? N -  In the past six months, have you accidently leaked urine? N -  Do you have problems with loss of bowel control? N -  Managing your Medications? N -  Managing your Finances? N -  Housekeeping or managing your Housekeeping? N -  Some recent data might be hidden    Patient Care Team: Leamon Arnt, MD as PCP - General (Family Medicine) Dermatology, [place], Reche Dixon, OD as Consulting Physician (Optometry)  Indicate any recent Medical Services you may have received from other than [facility] providers in the past year (date may be approximate).     Assessment:   This is a routine wellness examination for [place].  Hearing/Vision screen  Hearing Screening   125Hz  250Hz  500Hz  1000Hz  2000Hz  3000Hz  4000Hz  6000Hz  8000Hz   Right ear:           Left ear:           Comments: Pt denies any hearing issues   Vision Screening Comments: Pt [facility] up [facility] for [facility] exams   Dietary issues and exercise activities discussed: Current Exercise Habits:  Home exercise routine, Type of exercise: walking;yoga;Other - see comments (pilates), Time (Minutes): 60, Frequency (Times/Week): 6, Weekly Exercise (Minutes/Week): 360  Goals    . Patient Stated     None at this time    . Weight (lb) < 135 lb (61.2 kg)     Lose weight by decreasing carb intake.       Depression Screen PHQ 2/9 Scores 11/03/2020 05/09/2020 10/05/2019 09/28/2018 01/10/2018  PHQ - 2 Score 0 0 0 0 0  PHQ- 9 Score - - - - 0    Fall Risk Fall Risk  11/03/2020 05/09/2020 10/05/2019 09/28/2018 01/10/2018  Falls in the past year? 0 0 0 0 No  Number falls in past yr: 0 0 0 - -  Injury with Fall? 0 - 0 - -  Risk for fall due to : Impaired vision - - - -  Follow up Falls prevention discussed - Falls evaluation completed;Education provided;Falls prevention discussed - -    FALL RISK PREVENTION PERTAINING TO THE HOME:  Any stairs in or around the home? Yes  If so, are there any without handrails? Yes  Home free of loose throw rugs in walkways, pet beds, electrical cords, etc? Yes  Adequate lighting in your home to reduce risk of falls? Yes   ASSISTIVE DEVICES UTILIZED TO PREVENT FALLS:  Life alert? No  Use of a cane, walker or w/c? No  Grab bars in the bathroom? Yes  Shower chair or bench in shower? No  Elevated toilet seat or a handicapped toilet? No   TIMED UP AND GO:  Was the test performed? No     Cognitive Function: MMSE - Mini Mental State Exam 09/28/2018  Orientation to time 5  Orientation to Place 5  Registration 3  Attention/ Calculation 5  Recall 3  Language- name 2 objects 2  Language- repeat 1  Language- follow 3 step command 3  Language- read & follow direction 1  Write a sentence 1  Copy design 1  Total score 30     6CIT Screen 11/03/2020  What Year? 0 points  What month? 0 points  Count back from 20 0 points  Months  in reverse 0 points  Repeat phrase 0 points    Immunizations Immunization History  Administered Date(s) Administered  .  Influenza, High Dose Seasonal PF 06/04/2015  . Influenza,inj,Quad PF,6+ Mos 05/07/2020, 06/11/2020  . Moderna Sars-Covid-2 Vaccination 09/05/2019, 10/04/2019, 07/29/2020  . Tdap 11/09/2010    TDAP status: Up to date  Flu Vaccine status: Up to date   Covid-19 vaccine status: Completed vaccines  Qualifies for Shingles Vaccine? Yes   Zostavax completed No   Shingrix Completed?: No.    Education has been provided regarding the importance of this vaccine. Patient has been advised to call insurance company to determine out of pocket expense if they have not yet received this vaccine. Advised may also receive vaccine at local pharmacy or Health Dept. Verbalized acceptance and understanding.  Screening Tests Health Maintenance  Topic Date Due  . TETANUS/TDAP  11/08/2020  . COVID-19 Vaccine (4 - Booster for Moderna series) 01/27/2021  . DEXA SCAN  02/20/2021  . MAMMOGRAM  02/26/2021  . COLONOSCOPY (Pts 45-47yrs Insurance coverage will need to be confirmed)  06/16/2022  . INFLUENZA VACCINE  Completed  . Hepatitis C Screening  Completed  . HPV VACCINES  Aged Out  . PNA vac Low Risk Adult  Discontinued    Health Maintenance  There are no preventive care reminders to display for this patient.  Colorectal cancer screening: Type of screening: Colonoscopy. Completed 06/16/12. Repeat every 10 years  Mammogram status: Completed 02/27/20. Repeat every year  Bone Density status: Completed 02/21/19. Results reflect: Bone density results: OSTEOPENIA. Repeat every 2 years.   Additional Screening:  Hepatitis C Screening: Completed 02/19/16  Vision Screening: Recommended annual ophthalmology exams for early detection of glaucoma and other disorders of the eye. Is the patient up to date with their annual eye exam?  Yes  Who is the provider or what is the name of the office in which the patient attends annual eye exams? [facility] If pt is not established with a provider, would they like to be  referred to a provider to establish care? No .   Dental Screening: Recommended annual dental exams for proper oral hygiene  Community Resource Referral / Chronic Care Management: CRR required this visit?  No   CCM required this visit?  No      Plan:     I have personally reviewed and noted the following in the patient's chart:   . Medical and social history . Use of alcohol, tobacco or illicit drugs  . Current medications and supplements . Functional ability and status . Nutritional status . Physical activity . Advanced directives . List of other physicians . Hospitalizations, surgeries, and ER visits in previous 12 months . Vitals . Screenings to include cognitive, depression, and falls . Referrals and appointments  In addition, I have reviewed and discussed with patient certain preventive protocols, quality metrics, and best practice recommendations. A written personalized care plan for preventive services as well as general preventive health recommendations were provided to patient.     Willette Brace, LPN   02/19/4764   Nurse Notes: None

## 2020-11-03 NOTE — Patient Instructions (Addendum)
Kristi Jones , Thank you for taking time to come for your Medicare Wellness Visit. I appreciate your ongoing commitment to your health goals. Please review the following plan we discussed and let me know if I can assist you in the future.   Screening recommendations/referrals: Colonoscopy: Done 06/16/12 Mammogram: Done 02/27/20 Bone Density: Done 02/21/19 Recommended yearly ophthalmology/optometry visit for glaucoma screening and checkup Recommended yearly dental visit for hygiene and checkup  Vaccinations: Influenza vaccine: Up to date Tdap vaccine: Up to date Shingles vaccine: Shingrix, Please contact your pharmacy for coverage information.    Covid-19:Completed 1/13, 2/1, & 07/29/20  Advanced directives: Copies in chart  Conditions/risks identified: None at this time  Next appointment: Follow up in one year for your annual wellness visit    Preventive Care 65 Years and Older, Female Preventive care refers to lifestyle choices and visits with your health care provider that can promote health and wellness. What does preventive care include?  A yearly physical exam. This is also called an annual well check.  Dental exams once or twice a year.  Routine eye exams. Ask your health care provider how often you should have your eyes checked.  Personal lifestyle choices, including:  Daily care of your teeth and gums.  Regular physical activity.  Eating a healthy diet.  Avoiding tobacco and drug use.  Limiting alcohol use.  Practicing safe sex.  Taking low-dose aspirin every day.  Taking vitamin and mineral supplements as recommended by your health care provider. What happens during an annual well check? The services and screenings done by your health care provider during your annual well check will depend on your age, overall health, lifestyle risk factors, and family history of disease. Counseling  Your health care provider may ask you questions about your:  Alcohol  use.  Tobacco use.  Drug use.  Emotional well-being.  Home and relationship well-being.  Sexual activity.  Eating habits.  History of falls.  Memory and ability to understand (cognition).  Work and work Statistician.  Reproductive health. Screening  You may have the following tests or measurements:  Height, weight, and BMI.  Blood pressure.  Lipid and cholesterol levels. These may be checked every 5 years, or more frequently if you are over 4 years old.  Skin check.  Lung cancer screening. You may have this screening every year starting at age 40 if you have a 30-pack-year history of smoking and currently smoke or have quit within the past 15 years.  Fecal occult blood test (FOBT) of the stool. You may have this test every year starting at age 75.  Flexible sigmoidoscopy or colonoscopy. You may have a sigmoidoscopy every 5 years or a colonoscopy every 10 years starting at age 58.  Hepatitis C blood test.  Hepatitis B blood test.  Sexually transmitted disease (STD) testing.  Diabetes screening. This is done by checking your blood sugar (glucose) after you have not eaten for a while (fasting). You may have this done every 1-3 years.  Bone density scan. This is done to screen for osteoporosis. You may have this done starting at age 78.  Mammogram. This may be done every 1-2 years. Talk to your health care provider about how often you should have regular mammograms. Talk with your health care provider about your test results, treatment options, and if necessary, the need for more tests. Vaccines  Your health care provider may recommend certain vaccines, such as:  Influenza vaccine. This is recommended every year.  Tetanus, diphtheria,  and acellular pertussis (Tdap, Td) vaccine. You may need a Td booster every 10 years.  Zoster vaccine. You may need this after age 81.  Pneumococcal 13-valent conjugate (PCV13) vaccine. One dose is recommended after age  29.  Pneumococcal polysaccharide (PPSV23) vaccine. One dose is recommended after age 35. Talk to your health care provider about which screenings and vaccines you need and how often you need them. This information is not intended to replace advice given to you by your health care provider. Make sure you discuss any questions you have with your health care provider. Document Released: 09/05/2015 Document Revised: 04/28/2016 Document Reviewed: 06/10/2015 Elsevier Interactive Patient Education  2017 [employer] Prevention in the Home Falls can cause injuries. They can happen to people of all ages. There are many things you can do to make your home safe and to help prevent falls. What can I do on the outside of my home?  Regularly fix the edges of walkways and driveways and fix any cracks.  Remove anything that might make you trip as you walk through a door, such as a raised step or threshold.  Trim any bushes or trees on the path to your home.  Use bright outdoor lighting.  Clear any walking paths of anything that might make someone trip, such as rocks or tools.  Regularly check to see if handrails are loose or broken. Make sure that both sides of any steps have handrails.  Any raised decks and porches should have guardrails on the edges.  Have any leaves, snow, or ice cleared regularly.  Use sand or salt on walking paths during winter.  Clean up any spills in your garage right away. This includes oil or grease spills. What can I do in the bathroom?  Use night lights.  Install grab bars by the toilet and in the tub and shower. Do not use towel bars as grab bars.  Use non-skid mats or decals in the tub or shower.  If you need to sit down in the shower, use a plastic, non-slip stool.  Keep the floor dry. Clean up any water that spills on the floor as soon as it happens.  Remove soap buildup in the tub or shower regularly.  Attach bath mats securely with double-sided  non-slip rug tape.  Do not have throw rugs and other things on the floor that can make you trip. What can I do in the bedroom?  Use night lights.  Make sure that you have a light by your bed that is easy to reach.  Do not use any sheets or blankets that are too big for your bed. They should not hang down onto the floor.  Have a firm chair that has side arms. You can use this for support while you get dressed.  Do not have throw rugs and other things on the floor that can make you trip. What can I do in the kitchen?  Clean up any spills right away.  Avoid walking on wet floors.  Keep items that you use a lot in easy-to-reach places.  If you need to reach something above you, use a strong step stool that has a grab bar.  Keep electrical cords out of the way.  Do not use floor polish or wax that makes floors slippery. If you must use wax, use non-skid floor wax.  Do not have throw rugs and other things on the floor that can make you trip. What can I do with my  stairs?  Do not leave any items on the stairs.  Make sure that there are handrails on both sides of the stairs and use them. Fix handrails that are broken or loose. Make sure that handrails are as long as the stairways.  Check any carpeting to make sure that it is firmly attached to the stairs. Fix any carpet that is loose or worn.  Avoid having throw rugs at the top or bottom of the stairs. If you do have throw rugs, attach them to the floor with carpet tape.  Make sure that you have a light switch at the top of the stairs and the bottom of the stairs. If you do not have them, ask someone to add them for you. What else can I do to help prevent falls?  Wear shoes that:  Do not have high heels.  Have rubber bottoms.  Are comfortable and fit you well.  Are closed at the toe. Do not wear sandals.  If you use a stepladder:  Make sure that it is fully opened. Do not climb a closed stepladder.  Make sure that both  sides of the stepladder are locked into place.  Ask someone to hold it for you, if possible.  Clearly mark and make sure that you can see:  Any grab bars or handrails.  First and last steps.  Where the edge of each step is.  Use tools that help you move around (mobility aids) if they are needed. These include:  Canes.  Walkers.  Scooters.  Crutches.  Turn on the lights when you go into a dark area. Replace any light bulbs as soon as they burn out.  Set up your furniture so you have a clear path. Avoid moving your furniture around.  If any of your floors are uneven, fix them.  If there are any pets around you, be aware of where they are.  Review your medicines with your doctor. Some medicines can make you feel dizzy. This can increase your chance of falling. Ask your doctor what other things that you can do to help prevent falls. This information is not intended to replace advice given to you by your health care provider. Make sure you discuss any questions you have with your health care provider. Document Released: 06/05/2009 Document Revised: 01/15/2016 Document Reviewed: 09/13/2014 Elsevier Interactive Patient Education  2017 [employer].

## 2020-11-07 DIAGNOSIS — H35033 Hypertensive retinopathy, bilateral: Secondary | ICD-10-CM | POA: Diagnosis not present

## 2020-11-07 DIAGNOSIS — I10 Essential (primary) hypertension: Secondary | ICD-10-CM | POA: Diagnosis not present

## 2020-11-07 DIAGNOSIS — H2513 Age-related nuclear cataract, bilateral: Secondary | ICD-10-CM | POA: Diagnosis not present

## 2020-11-07 DIAGNOSIS — H52223 Regular astigmatism, bilateral: Secondary | ICD-10-CM | POA: Diagnosis not present

## 2020-11-07 DIAGNOSIS — H5203 Hypermetropia, bilateral: Secondary | ICD-10-CM | POA: Diagnosis not present

## 2020-11-07 DIAGNOSIS — H524 Presbyopia: Secondary | ICD-10-CM | POA: Diagnosis not present

## 2021-01-07 ENCOUNTER — Encounter: Payer: Self-pay | Admitting: Family Medicine

## 2021-01-07 ENCOUNTER — Other Ambulatory Visit: Payer: Self-pay

## 2021-01-07 DIAGNOSIS — M810 Age-related osteoporosis without current pathological fracture: Secondary | ICD-10-CM

## 2021-01-17 ENCOUNTER — Other Ambulatory Visit: Payer: Self-pay | Admitting: Family Medicine

## 2021-01-20 NOTE — Telephone Encounter (Signed)
Okay to change medication?

## 2021-03-04 DIAGNOSIS — Z803 Family history of malignant neoplasm of breast: Secondary | ICD-10-CM | POA: Diagnosis not present

## 2021-03-04 DIAGNOSIS — Z780 Asymptomatic menopausal state: Secondary | ICD-10-CM | POA: Diagnosis not present

## 2021-03-04 DIAGNOSIS — M8589 Other specified disorders of bone density and structure, multiple sites: Secondary | ICD-10-CM | POA: Diagnosis not present

## 2021-03-04 DIAGNOSIS — Z1231 Encounter for screening mammogram for malignant neoplasm of breast: Secondary | ICD-10-CM | POA: Diagnosis not present

## 2021-03-04 LAB — HM DEXA SCAN

## 2021-03-19 ENCOUNTER — Encounter: Payer: Self-pay | Admitting: Family Medicine

## 2021-03-19 ENCOUNTER — Telehealth: Payer: Self-pay

## 2021-03-19 NOTE — Telephone Encounter (Signed)
DEXA - 03/04/21 osteopenia

## 2021-03-23 DIAGNOSIS — R922 Inconclusive mammogram: Secondary | ICD-10-CM | POA: Diagnosis not present

## 2021-03-23 DIAGNOSIS — R928 Other abnormal and inconclusive findings on diagnostic imaging of breast: Secondary | ICD-10-CM | POA: Diagnosis not present

## 2021-03-30 NOTE — Telephone Encounter (Signed)
DEXA has been scanned into chart 03/04/21

## 2021-04-01 ENCOUNTER — Other Ambulatory Visit: Payer: Self-pay | Admitting: Radiology

## 2021-04-01 DIAGNOSIS — N6321 Unspecified lump in the left breast, upper outer quadrant: Secondary | ICD-10-CM | POA: Diagnosis not present

## 2021-04-01 DIAGNOSIS — N6489 Other specified disorders of breast: Secondary | ICD-10-CM | POA: Diagnosis not present

## 2021-04-01 NOTE — Telephone Encounter (Signed)
Patient aware to stop fosamax and recheck in 2 years

## 2021-04-28 ENCOUNTER — Ambulatory Visit: Payer: Self-pay | Admitting: Surgery

## 2021-04-28 DIAGNOSIS — N6320 Unspecified lump in the left breast, unspecified quadrant: Secondary | ICD-10-CM

## 2021-04-28 DIAGNOSIS — N6321 Unspecified lump in the left breast, upper outer quadrant: Secondary | ICD-10-CM | POA: Diagnosis not present

## 2021-05-14 ENCOUNTER — Other Ambulatory Visit: Payer: Self-pay

## 2021-05-14 ENCOUNTER — Encounter (HOSPITAL_BASED_OUTPATIENT_CLINIC_OR_DEPARTMENT_OTHER): Payer: Self-pay | Admitting: Surgery

## 2021-05-15 ENCOUNTER — Encounter (HOSPITAL_BASED_OUTPATIENT_CLINIC_OR_DEPARTMENT_OTHER)
Admission: RE | Admit: 2021-05-15 | Discharge: 2021-05-15 | Disposition: A | Discharge status: Home / Self Care | Payer: Medicare Other | Source: Ambulatory Visit | Attending: Surgery | Admitting: Surgery

## 2021-05-15 DIAGNOSIS — I10 Essential (primary) hypertension: Secondary | ICD-10-CM | POA: Diagnosis not present

## 2021-05-15 DIAGNOSIS — Z01812 Encounter for preprocedural laboratory examination: Secondary | ICD-10-CM | POA: Diagnosis not present

## 2021-05-15 LAB — CBC WITH DIFFERENTIAL/PLATELET
Abs Immature Granulocytes: 0.02 10*3/uL (ref 0.00–0.07)
Basophils Absolute: 0 10*3/uL (ref 0.0–0.1)
Basophils Relative: 0 %
Eosinophils Absolute: 0.2 10*3/uL (ref 0.0–0.5)
Eosinophils Relative: 2 %
HCT: 39.7 % (ref 36.0–46.0)
Hemoglobin: 13.2 g/dL (ref 12.0–15.0)
Immature Granulocytes: 0 %
Lymphocytes Relative: 19 %
Lymphs Abs: 1.3 10*3/uL (ref 0.7–4.0)
MCH: 29.3 pg (ref 26.0–34.0)
MCHC: 33.2 g/dL (ref 30.0–36.0)
MCV: 88.2 fL (ref 80.0–100.0)
Monocytes Absolute: 0.4 10*3/uL (ref 0.1–1.0)
Monocytes Relative: 6 %
Neutro Abs: 5.1 10*3/uL (ref 1.7–7.7)
Neutrophils Relative %: 73 %
Platelets: 200 10*3/uL (ref 150–400)
RBC: 4.5 MIL/uL (ref 3.87–5.11)
RDW: 13.2 % (ref 11.5–15.5)
WBC: 7 10*3/uL (ref 4.0–10.5)
nRBC: 0 % (ref 0.0–0.2)

## 2021-05-15 LAB — COMPREHENSIVE METABOLIC PANEL
ALT: 13 U/L (ref 0–44)
AST: 20 U/L (ref 15–41)
Albumin: 3.8 g/dL (ref 3.5–5.0)
Alkaline Phosphatase: 55 U/L (ref 38–126)
Anion gap: 7 (ref 5–15)
BUN: 12 mg/dL (ref 8–23)
CO2: 28 mmol/L (ref 22–32)
Calcium: 9.6 mg/dL (ref 8.9–10.3)
Chloride: 102 mmol/L (ref 98–111)
Creatinine, Ser: 0.8 mg/dL (ref 0.44–1.00)
GFR, Estimated: >60 mL/min (ref >60)
Glucose, Bld: 97 mg/dL (ref 70–99)
Potassium: 4.9 mmol/L (ref 3.5–5.1)
Sodium: 137 mmol/L (ref 135–145)
Total Bilirubin: 1.2 mg/dL (ref 0.3–1.2)
Total Protein: 6.7 g/dL (ref 6.5–8.1)

## 2021-05-15 NOTE — Progress Notes (Signed)

## 2021-05-19 ENCOUNTER — Encounter (HOSPITAL_BASED_OUTPATIENT_CLINIC_OR_DEPARTMENT_OTHER): Payer: Self-pay | Admitting: Surgery

## 2021-05-19 DIAGNOSIS — N6321 Unspecified lump in the left breast, upper outer quadrant: Secondary | ICD-10-CM | POA: Diagnosis not present

## 2021-05-19 NOTE — Anesthesia Preprocedure Evaluation (Addendum)
Anesthesia Evaluation  Patient identified by MRN, date of birth, ID band Patient awake    Reviewed: Allergy & Precautions, NPO status , Patient's Chart, lab work & pertinent test results, reviewed documented beta blocker date and time   Airway Mallampati: II  TM Distance: >3 FB Neck ROM: Full    Dental  (+) Dental Advisory Given   Pulmonary neg pulmonary ROS,    Pulmonary exam normal breath sounds clear to auscultation       Cardiovascular hypertension, Pt. on medications Normal cardiovascular exam Rhythm:Regular Rate:Normal     Neuro/Psych negative neurological ROS  negative psych ROS   GI/Hepatic negative GI ROS, (+) Hepatitis -, A  Endo/Other  Hypothyroidism Hyperlipidemia Left Breast mass Osteoporosis  Renal/GU negative Renal ROS  negative genitourinary   Musculoskeletal  (+) Arthritis , Osteoarthritis,    Abdominal   Peds  Hematology negative hematology ROS (+)   Anesthesia Other Findings   Reproductive/Obstetrics                           Anesthesia Physical Anesthesia Plan  ASA: 2  Anesthesia Plan: General   Post-op Pain Management:    Induction: Intravenous  PONV Risk Score and Plan: 4 or greater and Treatment may vary due to age or medical condition, Ondansetron and Dexamethasone  Airway Management Planned: LMA  Additional Equipment: None  Intra-op Plan:   Post-operative Plan: Extubation in OR  Informed Consent: I have reviewed the patients History and Physical, chart, labs and discussed the procedure including the risks, benefits and alternatives for the proposed anesthesia with the patient or authorized representative who has indicated his/her understanding and acceptance.     Dental advisory given  Plan Discussed with: CRNA and Anesthesiologist  Anesthesia Plan Comments:        Anesthesia Quick Evaluation

## 2021-05-20 ENCOUNTER — Ambulatory Visit (HOSPITAL_BASED_OUTPATIENT_CLINIC_OR_DEPARTMENT_OTHER): Payer: Medicare Other | Admitting: Anesthesiology

## 2021-05-20 ENCOUNTER — Encounter (HOSPITAL_BASED_OUTPATIENT_CLINIC_OR_DEPARTMENT_OTHER): Payer: Self-pay | Admitting: Surgery

## 2021-05-20 ENCOUNTER — Other Ambulatory Visit: Payer: Self-pay

## 2021-05-20 ENCOUNTER — Ambulatory Visit (HOSPITAL_BASED_OUTPATIENT_CLINIC_OR_DEPARTMENT_OTHER)
Admission: RE | Admit: 2021-05-20 | Discharge: 2021-05-20 | Disposition: A | Discharge status: Home / Self Care | Payer: Medicare Other | Attending: Surgery | Admitting: Surgery

## 2021-05-20 ENCOUNTER — Encounter (HOSPITAL_BASED_OUTPATIENT_CLINIC_OR_DEPARTMENT_OTHER)
Admission: RE | Disposition: A | Discharge status: Home / Self Care | Payer: Self-pay | Source: Home / Self Care | Attending: Surgery

## 2021-05-20 DIAGNOSIS — I10 Essential (primary) hypertension: Secondary | ICD-10-CM | POA: Diagnosis not present

## 2021-05-20 DIAGNOSIS — E785 Hyperlipidemia, unspecified: Secondary | ICD-10-CM | POA: Insufficient documentation

## 2021-05-20 DIAGNOSIS — Z880 Allergy status to penicillin: Secondary | ICD-10-CM | POA: Insufficient documentation

## 2021-05-20 DIAGNOSIS — Z7982 Long term (current) use of aspirin: Secondary | ICD-10-CM | POA: Diagnosis not present

## 2021-05-20 DIAGNOSIS — N6489 Other specified disorders of breast: Secondary | ICD-10-CM | POA: Diagnosis not present

## 2021-05-20 DIAGNOSIS — E039 Hypothyroidism, unspecified: Secondary | ICD-10-CM | POA: Insufficient documentation

## 2021-05-20 DIAGNOSIS — Z79890 Hormone replacement therapy (postmenopausal): Secondary | ICD-10-CM | POA: Diagnosis not present

## 2021-05-20 DIAGNOSIS — Z803 Family history of malignant neoplasm of breast: Secondary | ICD-10-CM | POA: Insufficient documentation

## 2021-05-20 DIAGNOSIS — Z888 Allergy status to other drugs, medicaments and biological substances status: Secondary | ICD-10-CM | POA: Insufficient documentation

## 2021-05-20 DIAGNOSIS — Z79899 Other long term (current) drug therapy: Secondary | ICD-10-CM | POA: Diagnosis not present

## 2021-05-20 DIAGNOSIS — D242 Benign neoplasm of left breast: Secondary | ICD-10-CM | POA: Diagnosis not present

## 2021-05-20 DIAGNOSIS — Z881 Allergy status to other antibiotic agents status: Secondary | ICD-10-CM | POA: Diagnosis not present

## 2021-05-20 DIAGNOSIS — N6092 Unspecified benign mammary dysplasia of left breast: Secondary | ICD-10-CM | POA: Diagnosis not present

## 2021-05-20 DIAGNOSIS — Z882 Allergy status to sulfonamides status: Secondary | ICD-10-CM | POA: Insufficient documentation

## 2021-05-20 DIAGNOSIS — R928 Other abnormal and inconclusive findings on diagnostic imaging of breast: Secondary | ICD-10-CM | POA: Diagnosis not present

## 2021-05-20 DIAGNOSIS — N6321 Unspecified lump in the left breast, upper outer quadrant: Secondary | ICD-10-CM | POA: Diagnosis not present

## 2021-05-20 HISTORY — PX: BREAST LUMPECTOMY WITH RADIOACTIVE SEED LOCALIZATION: SHX6424

## 2021-05-20 SURGERY — BREAST LUMPECTOMY WITH RADIOACTIVE SEED LOCALIZATION
Anesthesia: General | Site: Breast | Laterality: Left

## 2021-05-20 MED ORDER — PROPOFOL 10 MG/ML IV BOLUS
INTRAVENOUS | Status: AC
  Filled 2021-05-20: qty 20

## 2021-05-20 MED ORDER — ONDANSETRON HCL 4 MG/2ML IJ SOLN
4.0000 mg | Freq: Once | INTRAMUSCULAR | Status: DC | PRN
Start: 2021-05-20 — End: 2021-05-20

## 2021-05-20 MED ORDER — HYDROCODONE-ACETAMINOPHEN 5-325 MG PO TABS: 1.0000 | ORAL_TABLET | Freq: Four times a day (QID) | ORAL | 0 refills | Status: DC | PRN

## 2021-05-20 MED ORDER — CHLORHEXIDINE GLUCONATE CLOTH 2 % EX PADS: 6.0000 | MEDICATED_PAD | Freq: Once | CUTANEOUS | Status: DC

## 2021-05-20 MED ORDER — EPHEDRINE SULFATE-NACL 50-0.9 MG/10ML-% IV SOSY
PREFILLED_SYRINGE | INTRAVENOUS | Status: DC | PRN
  Administered 2021-05-20 (×2): 10 mg via INTRAVENOUS

## 2021-05-20 MED ORDER — LACTATED RINGERS IV SOLN: INTRAVENOUS | Status: DC

## 2021-05-20 MED ORDER — FENTANYL CITRATE (PF) 100 MCG/2ML IJ SOLN
INTRAMUSCULAR | Status: DC | PRN
  Administered 2021-05-20: 50 ug via INTRAVENOUS

## 2021-05-20 MED ORDER — IBUPROFEN 800 MG PO TABS: 800.0000 mg | ORAL_TABLET | Freq: Three times a day (TID) | ORAL | 0 refills | Status: DC | PRN

## 2021-05-20 MED ORDER — OXYCODONE HCL 5 MG/5ML PO SOLN
5.0000 mg | Freq: Once | ORAL | Status: DC | PRN
Start: 2021-05-20 — End: 2021-05-20

## 2021-05-20 MED ORDER — ONDANSETRON HCL 4 MG/2ML IJ SOLN
INTRAMUSCULAR | Status: DC | PRN
  Administered 2021-05-20: 4 mg via INTRAVENOUS

## 2021-05-20 MED ORDER — DEXAMETHASONE SODIUM PHOSPHATE 10 MG/ML IJ SOLN
INTRAMUSCULAR | Status: DC | PRN
  Administered 2021-05-20: 8 mg via INTRAVENOUS

## 2021-05-20 MED ORDER — VANCOMYCIN HCL 500 MG IV SOLR
INTRAVENOUS | Status: AC
  Filled 2021-05-20: qty 30

## 2021-05-20 MED ORDER — SODIUM CHLORIDE 0.9 % IV SOLN
INTRAVENOUS | Status: AC
  Filled 2021-05-20 (×3): qty 10

## 2021-05-20 MED ORDER — VANCOMYCIN HCL 500 MG IV SOLR
INTRAVENOUS | Status: DC | PRN
  Administered 2021-05-20: 500 mg via TOPICAL

## 2021-05-20 MED ORDER — LIDOCAINE 2% (20 MG/ML) 5 ML SYRINGE
INTRAMUSCULAR | Status: DC | PRN
Start: 2021-05-20 — End: 2021-05-20
  Administered 2021-05-20: 60 mg via INTRAVENOUS

## 2021-05-20 MED ORDER — CLINDAMYCIN PHOSPHATE 900 MG/50ML IV SOLN
900.0000 mg | INTRAVENOUS | Status: AC
  Administered 2021-05-20: 900 mg via INTRAVENOUS

## 2021-05-20 MED ORDER — CLINDAMYCIN PHOSPHATE 900 MG/50ML IV SOLN
INTRAVENOUS | Status: AC
  Filled 2021-05-20: qty 50

## 2021-05-20 MED ORDER — SODIUM CHLORIDE 0.9 % IV SOLN: INTRAVENOUS | Status: DC | PRN

## 2021-05-20 MED ORDER — LIDOCAINE 2% (20 MG/ML) 5 ML SYRINGE
INTRAMUSCULAR | Status: AC
  Filled 2021-05-20: qty 15

## 2021-05-20 MED ORDER — FENTANYL CITRATE (PF) 100 MCG/2ML IJ SOLN
INTRAMUSCULAR | Status: AC
  Filled 2021-05-20: qty 2

## 2021-05-20 MED ORDER — ONDANSETRON HCL 4 MG/2ML IJ SOLN
INTRAMUSCULAR | Status: AC
  Filled 2021-05-20: qty 2

## 2021-05-20 MED ORDER — PROPOFOL 10 MG/ML IV BOLUS
INTRAVENOUS | Status: DC | PRN
  Administered 2021-05-20: 120 mg via INTRAVENOUS

## 2021-05-20 MED ORDER — OXYCODONE HCL 5 MG PO TABS: 5.0000 mg | ORAL_TABLET | Freq: Once | ORAL | Status: DC | PRN

## 2021-05-20 MED ORDER — BUPIVACAINE-EPINEPHRINE (PF) 0.25% -1:200000 IJ SOLN
INTRAMUSCULAR | Status: AC
  Filled 2021-05-20: qty 90

## 2021-05-20 MED ORDER — BUPIVACAINE-EPINEPHRINE (PF) 0.25% -1:200000 IJ SOLN
INTRAMUSCULAR | Status: DC | PRN
  Administered 2021-05-20: 20 mL via PERINEURAL

## 2021-05-20 MED ORDER — DEXAMETHASONE SODIUM PHOSPHATE 10 MG/ML IJ SOLN
INTRAMUSCULAR | Status: AC
  Filled 2021-05-20: qty 1

## 2021-05-20 MED ORDER — FENTANYL CITRATE (PF) 100 MCG/2ML IJ SOLN: 25.0000 ug | INTRAMUSCULAR | Status: DC | PRN

## 2021-05-20 SURGICAL SUPPLY — 37 items
ADH SKN CLS APL DERMABOND .7 (GAUZE/BANDAGES/DRESSINGS) ×1
APL PRP STRL LF DISP 70% ISPRP (MISCELLANEOUS) ×1
BAG DECANTER FOR FLEXI CONT (MISCELLANEOUS) ×1 IMPLANT
BINDER BREAST LRG (GAUZE/BANDAGES/DRESSINGS) ×1 IMPLANT
BLADE SURG 15 STRL LF DISP TIS (BLADE) ×1 IMPLANT
BLADE SURG 15 STRL SS (BLADE) ×2
CHLORAPREP W/TINT 26 (MISCELLANEOUS) ×2 IMPLANT
COVER BACK TABLE 60X90IN (DRAPES) ×2 IMPLANT
COVER MAYO STAND STRL (DRAPES) ×2 IMPLANT
COVER PROBE W GEL 5X96 (DRAPES) ×2 IMPLANT
DERMABOND ADVANCED (GAUZE/BANDAGES/DRESSINGS) ×1
DERMABOND ADVANCED .7 DNX12 (GAUZE/BANDAGES/DRESSINGS) ×1 IMPLANT
DRAPE LAPAROTOMY 100X72 PEDS (DRAPES) ×2 IMPLANT
DRAPE UTILITY XL STRL (DRAPES) ×2 IMPLANT
ELECT COATED BLADE 2.86 ST (ELECTRODE) ×2 IMPLANT
ELECT REM PT RETURN 9FT ADLT (ELECTROSURGICAL) ×2
ELECTRODE REM PT RTRN 9FT ADLT (ELECTROSURGICAL) ×1 IMPLANT
GLOVE SRG 8 PF TXTR STRL LF DI (GLOVE) ×1 IMPLANT
GLOVE SURG LTX SZ8 (GLOVE) ×2 IMPLANT
GLOVE SURG UNDER POLY LF SZ7 (GLOVE) ×2 IMPLANT
GLOVE SURG UNDER POLY LF SZ8 (GLOVE) ×4
GOWN STRL REUS W/ TWL LRG LVL3 (GOWN DISPOSABLE) ×2 IMPLANT
GOWN STRL REUS W/ TWL XL LVL3 (GOWN DISPOSABLE) ×1 IMPLANT
GOWN STRL REUS W/TWL LRG LVL3 (GOWN DISPOSABLE) ×2
GOWN STRL REUS W/TWL XL LVL3 (GOWN DISPOSABLE) ×2
KIT MARKER MARGIN INK (KITS) ×2 IMPLANT
NDL HYPO 25X1 1.5 SAFETY (NEEDLE) ×1 IMPLANT
NEEDLE HYPO 25X1 1.5 SAFETY (NEEDLE) ×2 IMPLANT
PACK BASIN DAY SURGERY FS (CUSTOM PROCEDURE TRAY) ×2 IMPLANT
PENCIL SMOKE EVACUATOR (MISCELLANEOUS) ×2 IMPLANT
SLEEVE SCD COMPRESS KNEE MED (STOCKING) ×2 IMPLANT
SPONGE T-LAP 4X18 ~~LOC~~+RFID (SPONGE) ×2 IMPLANT
SUT MNCRL AB 4-0 PS2 18 (SUTURE) ×2 IMPLANT
SUT VICRYL 3-0 CR8 SH (SUTURE) ×2 IMPLANT
SYR CONTROL 10ML LL (SYRINGE) ×2 IMPLANT
TOWEL GREEN STERILE FF (TOWEL DISPOSABLE) ×2 IMPLANT
TRAY FAXITRON CT DISP (TRAY / TRAY PROCEDURE) ×2 IMPLANT

## 2021-05-20 NOTE — Transfer of Care (Signed)
Immediate Anesthesia Transfer of Care Note  Patient: Kristi Jones  Procedure(s) Performed: LEFT BREAST LUMPECTOMY WITH RADIOACTIVE SEED LOCALIZATION (Left: Breast)  Patient Location: PACU  Anesthesia Type:General  Level of Consciousness: awake, alert  and oriented  Airway & Oxygen Therapy: Patient Spontanous Breathing and Patient connected to face mask oxygen  Post-op Assessment: Report given to RN, Post -op Vital signs reviewed and stable and Patient moving all extremities X 4  Post vital signs: Reviewed and stable  Last Vitals:  Vitals Value Taken Time  BP 136/66   Temp    Pulse 80 05/20/21 0929  Resp 14 05/20/21 0929  SpO2 100 % 05/20/21 0929  Vitals shown include unvalidated device data.  Last Pain:  Vitals:   05/20/21 0724  TempSrc: Oral  PainSc: 0-No pain      Patients Stated Pain Goal: 9 (53/79/43 2761)  Complications: No notable events documented.

## 2021-05-20 NOTE — Op Note (Signed)
Preoperative diagnosis: Left breast mass upper outer quadrant  Postoperative diagnosis: Same  Procedure: Left breast seed localized lumpectomy  Surgeon: Erroll Luna, MD  Anesthesia: LMA with 0.25% Marcaine plain  EBL: Minimal  Specimen: Left breast mass measuring about 2-1/2 cm.  The Faxitron image showed seed and clip in the specimen.  IV fluids: Per anesthesia record  Indications for procedure: The patient is a 75 year old female found to have a left breast mass upper outer quadrant.  This was about 2 cm but had been growing.  Core biopsy showed a fibroepithelial lesion.  No atypia was identified but given change in size the patient desired excision.  Risks and benefits of lumpectomy were discussed.  The use of the seed was discussed.  Observation was discussed.  Risk of bleeding, infection, cosmetic deformity, drainage, pain, poor wound healing, the need for other procedures, death, DVT, cardiovascular risk, anesthesia risk, and the need for treatments and/or procedures going forward were discussed.  She understood and agreed to proceed.  Description of procedure: The patient was met in the holding area and questions were answered.  The left breast was marked as the correct site.  Her films for her from her seed were available.  She was then taken back to the operative room.  She was placed supine upon the OR table.  After induction of general esthesia, left breast was prepped and draped in a sterile fashion.  Timeout performed.  Proper patient, site and procedure were verified.  Neoprobe used to identify the seed left breast upper outer quadrant.  Incision was made over the seed signal as well as mass which was palpable.  The entire mass was excised with a grossly negative margin.  The Faxitron image revealed the seed and clip to be present.  Background counts approached 0 to the left breast.  The cavity was then infiltrated local anesthetic and vancomycin powder was placed.  The cavity is  closed with 3-0 Vicryl and 4-0 Monocryl.  Dermabond applied.  All counts found to be correct.  Breast binder placed.  The patient was then awoke extubated taken to recovery in satisfactory condition.

## 2021-05-20 NOTE — Anesthesia Procedure Notes (Addendum)
Procedure Name: LMA Insertion Date/Time: 05/20/2021 8:43 AM Performed by: Niel Hummer, CRNA Pre-anesthesia Checklist: Patient identified, Emergency Drugs available, Suction available and Patient being monitored Patient Re-evaluated:Patient Re-evaluated prior to induction Oxygen Delivery Method: Circle system utilized Preoxygenation: Pre-oxygenation with 100% oxygen Induction Type: IV induction LMA: LMA inserted LMA Size: 4.0 Number of attempts: 1 Dental Injury: Teeth and Oropharynx as per pre-operative assessment

## 2021-05-20 NOTE — Interval H&P Note (Signed)
History and Physical Interval Note:  05/20/2021 8:34 AM  Kristi Jones  has presented today for surgery, with the diagnosis of LEFT BREAST MASS.  The various methods of treatment have been discussed with the patient and family. After consideration of risks, benefits and other options for treatment, the patient has consented to  Procedure(s): LEFT BREAST LUMPECTOMY WITH RADIOACTIVE SEED LOCALIZATION (Left) as a surgical intervention.  The patient's history has been reviewed, patient examined, no change in status, stable for surgery.  I have reviewed the patient's chart and labs.  Questions were answered to the patient's satisfaction.     [place]

## 2021-05-20 NOTE — H&P (Signed)
Subjective   Chief Complaint: No chief complaint on file.   History of Present Illness: Kristi Jones is a 75 y.o. female who is seen today as an office consultation at the request of Dr. Luan Pulling for evaluation of abnormal mammogram .   Patient noted on recent mammogram to have a left breast mass at 1:00. This has been present for years and the patient states she has had this area aspirated. On the more recent mammogram there was noted to be 2.3 cm x 1.3 cm x 1.9 cm. It was suspicious prompting a core biopsy. This showed a sclerotic fibroepithelial lesion. She states the mass has been present for many years and thinks it may be a little bit bigger. She has had no nipple discharge or drainage.  Review of Systems: A complete review of systems was obtained from the patient. I have reviewed this information and discussed as appropriate with the patient. See HPI as well for other ROS.    Medical History: Past Medical History:  Diagnosis Date   Hyperlipidemia   Hypertension   Thyroid disease   There is no problem list on file for this patient.  Past Surgical History:  Procedure Laterality Date   APPENDECTOMY   HERNIA REPAIR   HYSTERECTOMY    Allergies  Allergen Reactions   Penicillins Anaphylaxis and Other (See Comments)  Childhood allergy- reaction not recalled Tolerates cephalosporins   Sulfa (Sulfonamide Antibiotics) Anaphylaxis   Simvastatin Other (See Comments)  myalgia Muscle pain   Lisinopril Cough   Current Outpatient Medications on File Prior to Visit  Medication Sig Dispense Refill   aspirin 81 MG EC tablet Take 81 mg by mouth once daily   folic acid (FOLVITE) 1 MG tablet Take 1,000 mcg by mouth once daily   hydroCHLOROthiazide (HYDRODIURIL) 25 MG tablet Take 25 mg by mouth every morning   levothyroxine (SYNTHROID) 88 MCG tablet Take 1 tablet (88 mcg total) by mouth daily before breakfast.   losartan (COZAAR) 50 MG tablet Take 50 mg by mouth once daily    rosuvastatin (CRESTOR) 5 MG tablet Take 5 mg by mouth at bedtime   No current facility-administered medications on file prior to visit.   Family History  Problem Relation Age of Onset   High blood pressure (Hypertension) Mother   Hyperlipidemia (Elevated cholesterol) Mother   Diabetes Mother   Myocardial Infarction (Heart attack) Mother   Stroke Father   Diabetes Father   Myocardial Infarction (Heart attack) Father   Obesity Sister   High blood pressure (Hypertension) Sister   Breast cancer Sister   Obesity Brother   High blood pressure (Hypertension) Brother   Hyperlipidemia (Elevated cholesterol) Brother   Diabetes Brother    Social History   Tobacco Use  Smoking Status Never Smoker  Smokeless Tobacco Never Used    Social History   Socioeconomic History   Marital status: Married  Tobacco Use   Smoking status: Never Smoker   Smokeless tobacco: Never Used  Substance and Sexual Activity   Alcohol use: Yes   Drug use: Never   Objective:   Vitals:  04/28/21 1528  BP: (!) 142/84  Pulse: 105  Weight: 60.7 kg (133 lb 12.8 oz)  Height: 165.1 cm (5\' 5" )   Body mass index is 22.27 kg/m.  Physical Exam Constitutional:  Appearance: Normal appearance.  HENT:  Head: Normocephalic and atraumatic.  Nose: Nose normal.  Mouth/Throat:  Mouth: Mucous membranes are moist.  Eyes:  Pupils: Pupils are equal, round, and reactive  to light.  Cardiovascular:  Rate and Rhythm: Normal rate.  Chest:  Breasts:  Right: Normal.  Left: Normal.   Musculoskeletal:  General: Normal range of motion.  Cervical back: Normal range of motion and neck supple.  Lymphadenopathy:  Upper Body:  Right upper body: No supraclavicular or axillary adenopathy.  Left upper body: No supraclavicular or axillary adenopathy.  Skin: General: Skin is warm and dry.  Neurological:  General: No focal deficit present.  Mental Status: She is alert.  Psychiatric:  Mood and Affect: Mood normal.   Behavior: Behavior normal.     Labs, Imaging and Diagnostic Testing: 2.3 mass left breast 1 o'clock  Core bx Fibroepithelial sclerotic lesion   Assessment and Plan:  Diagnoses and all orders for this visit:  Mass of upper outer quadrant of left breast  Discussed left breast lumpectomy. Patient has opted to have this removed. Risks and benefits of surgery discussed. Use of seed discussed. Potential complications and cosmetic issues discussed. Observation discussed as an alternative. Risk of bleeding, infection, recurrence, the need for more surgery, anesthesia complications discussed today. She agrees to proceed  Malignancy risk 5 %   Left breast mass  No follow-ups on file.  Kennieth Francois, MD   I spent a total of 35 minutes in both face-to-face and non-face-to-face activities for this visit on the date of this encounter.

## 2021-05-20 NOTE — Discharge Instructions (Addendum)
[facility] Office Phone Number [phone]  BREAST BIOPSY/ PARTIAL MASTECTOMY: POST OP INSTRUCTIONS  Always review your discharge instruction sheet given to you by the facility where your surgery was performed.  IF YOU HAVE DISABILITY OR FAMILY LEAVE FORMS, YOU MUST BRING THEM TO THE OFFICE FOR PROCESSING.  DO NOT GIVE THEM TO YOUR DOCTOR.  A prescription for pain medication may be given to you upon discharge.  Take your pain medication as prescribed, if needed.  If narcotic pain medicine is not needed, then you may take acetaminophen (Tylenol) or ibuprofen (Advil) as needed. Take your usually prescribed medications unless otherwise directed If you need a refill on your pain medication, please contact your pharmacy.  They will contact our office to request authorization.  Prescriptions will not be filled after 5pm or on week-ends. You should eat very light the first 24 hours after surgery, such as soup, crackers, pudding, etc.  Resume your normal diet the day after surgery. Most patients will experience some swelling and bruising in the breast.  Ice packs and a good support bra will help.  Swelling and bruising can take several days to resolve.  It is common to experience some constipation if taking pain medication after surgery.  Increasing fluid intake and taking a stool softener will usually help or prevent this problem from occurring.  A mild laxative (Milk of Magnesia or Miralax) should be taken according to package directions if there are no bowel movements after 48 hours. Unless discharge instructions indicate otherwise, you may remove your bandages 24-48 hours after surgery, and you may shower at that time.  You may have steri-strips (small skin tapes) in place directly over the incision.  These strips should be left on the skin for 7-10 days.  If your surgeon used skin glue on the incision, you may shower in 24 hours.  The glue will flake off over the next 2-3 weeks.  Any  sutures or staples will be removed at the office during your follow-up visit. ACTIVITIES:  You may resume regular daily activities (gradually increasing) beginning the next day.  Wearing a good support bra or sports bra minimizes pain and swelling.  You may have sexual intercourse when it is comfortable. You may drive when you no longer are taking prescription pain medication, you can comfortably wear a seatbelt, and you can safely maneuver your car and apply brakes. RETURN TO WORK:  ______________________________________________________________________________________ You should see your doctor in the office for a follow-up appointment approximately two weeks after your surgery.  Your doctor's nurse will typically make your follow-up appointment when she calls you with your pathology report.  Expect your pathology report 2-3 business days after your surgery.  You may call to check if you do not hear from us after three days. OTHER INSTRUCTIONS: _______________________________________________________________________________________________ _____________________________________________________________________________________________________________________________________ _____________________________________________________________________________________________________________________________________ _____________________________________________________________________________________________________________________________________  WHEN TO CALL YOUR DOCTOR: Fever over 101.0 Nausea and/or vomiting. Extreme swelling or bruising. Continued bleeding from incision. Increased pain, redness, or drainage from the incision.  The clinic staff is available to answer your questions during regular business hours.  Please don't hesitate to call and ask to speak to one of the nurses for clinical concerns.  If you have a medical emergency, go to the nearest emergency room or call 911.  A surgeon from [facility] is always on call at the hospital.  For further questions, please visit [email or web address]    Post Anesthesia Home Care Instructions  Activity: Get plenty of rest for the remainder of   the day. A responsible individual must stay with you for 24 hours following the procedure.  For the next 24 hours, DO NOT: -Drive a car -Operate machinery -Drink alcoholic beverages -Take any medication unless instructed by your physician -Make any legal decisions or sign important papers.  Meals: Start with liquid foods such as gelatin or soup. Progress to regular foods as tolerated. Avoid greasy, spicy, heavy foods. If nausea and/or vomiting occur, drink only clear liquids until the nausea and/or vomiting subsides. Call your physician if vomiting continues.  Special Instructions/Symptoms: Your throat may feel dry or sore from the anesthesia or the breathing tube placed in your throat during surgery. If this causes discomfort, gargle with warm salt water. The discomfort should disappear within 24 hours.  If you had a scopolamine patch placed behind your ear for the management of post- operative nausea and/or vomiting:  1. The medication in the patch is effective for 72 hours, after which it should be removed.  Wrap patch in a tissue and discard in the trash. Wash hands thoroughly with soap and water. 2. You may remove the patch earlier than 72 hours if you experience unpleasant side effects which may include dry mouth, dizziness or visual disturbances. 3. Avoid touching the patch. Wash your hands with soap and water after contact with the patch.      

## 2021-05-20 NOTE — Anesthesia Postprocedure Evaluation (Signed)
Anesthesia Post Note  Patient: Kristi Jones  Procedure(s) Performed: LEFT BREAST LUMPECTOMY WITH RADIOACTIVE SEED LOCALIZATION (Left: Breast)     Patient location during evaluation: PACU Anesthesia Type: General Level of consciousness: awake and alert and oriented Pain management: pain level controlled Vital Signs Assessment: post-procedure vital signs reviewed and stable Respiratory status: spontaneous breathing, nonlabored ventilation and respiratory function stable Cardiovascular status: blood pressure returned to baseline and stable Postop Assessment: no apparent nausea or vomiting Anesthetic complications: no   No notable events documented.  Last Vitals:  Vitals:   05/20/21 0945 05/20/21 1005  BP: 129/64 138/78  Pulse: 66 69  Resp: 13   Temp:  (!) 36.4 C  SpO2: 100% 99%    Last Pain:  Vitals:   05/20/21 1005  TempSrc:   PainSc: 0-No pain                 Teniola Tseng A.

## 2021-05-21 ENCOUNTER — Encounter (HOSPITAL_BASED_OUTPATIENT_CLINIC_OR_DEPARTMENT_OTHER): Payer: Self-pay | Admitting: Surgery

## 2021-05-28 LAB — SURGICAL PATHOLOGY

## 2021-06-03 ENCOUNTER — Encounter: Payer: Medicare Other | Admitting: Family Medicine

## 2021-06-15 ENCOUNTER — Ambulatory Visit (INDEPENDENT_AMBULATORY_CARE_PROVIDER_SITE_OTHER): Payer: Medicare Other | Admitting: Family Medicine

## 2021-06-15 ENCOUNTER — Other Ambulatory Visit: Payer: Self-pay

## 2021-06-15 ENCOUNTER — Encounter: Payer: Self-pay | Admitting: Family Medicine

## 2021-06-15 VITALS — BP 150/90 | HR 73 | Temp 97.9°F | Ht 65.0 in | Wt 132.4 lb

## 2021-06-15 DIAGNOSIS — M810 Age-related osteoporosis without current pathological fracture: Secondary | ICD-10-CM | POA: Diagnosis not present

## 2021-06-15 DIAGNOSIS — D734 Cyst of spleen: Secondary | ICD-10-CM

## 2021-06-15 DIAGNOSIS — I10 Essential (primary) hypertension: Secondary | ICD-10-CM

## 2021-06-15 DIAGNOSIS — D7389 Other diseases of spleen: Secondary | ICD-10-CM | POA: Diagnosis not present

## 2021-06-15 DIAGNOSIS — E782 Mixed hyperlipidemia: Secondary | ICD-10-CM

## 2021-06-15 DIAGNOSIS — E039 Hypothyroidism, unspecified: Secondary | ICD-10-CM

## 2021-06-15 DIAGNOSIS — D242 Benign neoplasm of left breast: Secondary | ICD-10-CM | POA: Diagnosis not present

## 2021-06-15 DIAGNOSIS — N6092 Unspecified benign mammary dysplasia of left breast: Secondary | ICD-10-CM | POA: Insufficient documentation

## 2021-06-15 MED ORDER — SHINGRIX 50 MCG/0.5ML IM SUSR
0.5000 mL | Freq: Once | INTRAMUSCULAR | 0 refills | Status: AC
Start: 2021-06-15 — End: 2021-06-15

## 2021-06-15 MED ORDER — LOSARTAN POTASSIUM-HCTZ 100-25 MG PO TABS: 1.0000 | ORAL_TABLET | Freq: Every day | ORAL | 3 refills | Status: DC

## 2021-06-15 NOTE — Patient Instructions (Signed)
Please return in 6 months for hypertension follow up.   I will release your lab results to you on your MyChart account with further instructions. Please reply with any questions.    I've ordered an abdominal MRI to follow up on a benign appearing splenic lesion; this should be done in November or December at the earliest.   Please take the prescription for Shingrix to the pharmacy so they may administer the vaccinations. Your insurance will then cover the injections.    If you have any questions or concerns, please don't hesitate to send me a message via MyChart or call the office at [phone]. Thank you for visiting with [place] today! It's our pleasure caring for you.

## 2021-06-15 NOTE — Progress Notes (Signed)
Subjective  Chief Complaint  Patient presents with   Hypertension   Hyperlipidemia   Hypothyroidism    HPI: Kristi Jones is a 75 y.o. female who presents to [facility] today for a Female Wellness Visit. She also has the concerns and/or needs as listed above in the chief complaint. These will be addressed in addition to the Health Maintenance Visit.   Wellness Visit: annual visit with health maintenance review and exam without Pap  HM: had lumpectomy for abnl mamml/bx: fortunately benign papilloma and atypical intraductal hyperplasia. Recovering well. Has f/u scheduled. Reviewed dexa findings. See below. Declines pneumonia vaccinations. Will get shingrix.  Chronic disease f/u and/or acute problem visit: (deemed necessary to be done in addition to the wellness visit): Osteoporosis: treated with fosamax x 5 years. Recent study shows osteopenia: DEXA 01/2021: [facility]: osteopenia w/ decrease in a few areas. Has been on fosamax for 5 years, rec drug holiday and recheck in 2 years.  On asa: ? For primary prevention. She is low to intermediate risk for vascular disease.  Reviewed ct scan 01/2020: had splenic lesions: recommend f/u 6-12 weeks with splenic MRI.  Low thyroid: feels fine. On meds. Due for recheck.  HLD: tolerating crestor HTN: has "been all over the place". Some elevated readings. On cozaar 50 and hctz 25. Compliant. Active. Eats well.   Assessment  1. HTN (hypertension), benign   2. Mixed hyperlipidemia   3. Acquired hypothyroidism   4. Intraductal papilloma of breast, left   5. Atypical ductal hyperplasia of left breast   6. Lesion of spleen   7. Age-related osteoporosis without current pathological fracture   8. Splenic cyst      Plan  Female Wellness Visit: Age appropriate Health Maintenance and Prevention measures were discussed with patient. Included topics are cancer screening recommendations, ways to keep healthy (see AVS) including  dietary and exercise recommendations, regular eye and dental care, use of seat belts, and avoidance of moderate alcohol use and tobacco use. Screens are up to date. Will get breast mri in December.  BMI: discussed patient's BMI and encouraged positive lifestyle modifications to help get to or maintain a target BMI. HM needs and immunizations were addressed and ordered. See below for orders. See HM and immunization section for updates.shingrix RX given with education Routine labs and screening tests ordered including cmp, cbc and lipids where appropriate. Discussed recommendations regarding Vit D and calcium supplementation (see AVS)  Chronic disease management visit and/or acute problem visit: Splenic lesions: f/u MRI ordered.  Osteoporosis: improved on fosamax x 5 years. Will take 2 year drug holiday and then recheck HTN: marginal control. Increase to hyzaar 100/25. [facility] HLD: recheck lipids today. Tolerates crestor 10 Low thyroid: stable weight and clinically euthyroid  Follow up: 6 mo for htn recheck  Orders Placed This Encounter  Procedures   MR Abdomen W Wo Contrast   Lipid panel   CBC with Differential/Platelet   Comprehensive metabolic panel   TSH   Meds ordered this encounter  Medications   losartan-hydrochlorothiazide (HYZAAR) 100-25 MG tablet    Sig: Take 1 tablet by mouth daily.    Dispense:  90 tablet    Refill:  3   Zoster Vaccine Adjuvanted [facility]) injection    Sig: Inject 0.5 mLs into the muscle once for 1 dose. Please give 2nd dose 2-6 months after first dose    Dispense:  2 each    Refill:  0  Body mass index is 22.03 kg/m. Wt Readings from Last 3 Encounters:  06/15/21 132 lb 6.4 oz (60.1 kg)  05/20/21 132 lb 7.9 oz (60.1 kg)  05/09/20 131 lb 9.6 oz (59.7 kg)     Patient Active Problem List   Diagnosis Date Noted   Hyperlipidemia 05/10/2014    Priority: 1.    Failed simvastatin due to myalgias    HTN (hypertension), benign 10/31/2013     Priority: 1.   Hypothyroidism 10/31/2013    Priority: 1.   Breast fibroadenoma, left 11/08/2014    Priority: 2.    Left subareolar.  Status post biopsy    Age-related osteoporosis without current pathological fracture 10/31/2013    Priority: 2.    DEXA 01/2013 - T - -2.3 with elevated FRAX of 20.9%; rec consideration of fosamax - pt declines now 04/2014/[facility] DEXA 11/2014 T= - 2.5 with elevated FRZS of 23% and elevated hip frac 4% - Fosamax started/[facility] DEXA 01/2017: T=-2.2, lowest; 4% improved in femur, 11% improvement in spine on fosamax/ 12/2017 DEXA 01/2019: R femur neck T-Score: -2.20 lowest on fosamax. Stable on meds. Continue 02/2019/cl[facility] DEXA 01/2021: [facility]: osteopenia w/ decrease in a few areas. Has been on fosamax for 5 years, rec drug holiday and recheck in 2 years. [facility]    Splenic cyst 05/09/2020    Priority: 3.    On CT scan 01/2020: pt reviewed with Dr. Jana Hakim ([facility]) who did NOT recommend rechecking with f/u MRI. Pt agrees.    Sebaceous cyst, upper back 06/15/2012    Priority: 3.   Intraductal papilloma of breast, left 06/15/2021    Priority: Medium     Lumpectomy 04/2021, no malignancy    Atypical ductal hyperplasia of left breast 06/15/2021    Priority: Medium     Lumpectomy 04/2021, no malignancy    Pneumococcal vaccine refused 01/24/2019   Health Maintenance  Topic Date Due   Zoster Vaccines- Shingrix (1 of 2) Never done   COVID-19 Vaccine (4 - Booster for Moderna series) 05/05/2021   MAMMOGRAM  03/23/2022   COLONOSCOPY (Pts 45-41yrs Insurance coverage will need to be confirmed)  06/16/2022   DEXA SCAN  03/05/2023   INFLUENZA VACCINE  Completed   Hepatitis C Screening  Completed   HPV VACCINES  Aged Out   Pneumonia Vaccine 32+ Years old  Discontinued   TETANUS/TDAP  Discontinued   Immunization History  Administered Date(s) Administered   Fluad Quad(high Dose 65+) 06/12/2021   Influenza, High Dose Seasonal PF 06/04/2015   Influenza,inj,Quad PF,6+ Mos  05/07/2020, 06/11/2020   Moderna SARS-COV2 Booster Vaccination 03/10/2021   Moderna Sars-Covid-2 Vaccination 09/05/2019, 10/04/2019, 07/29/2020   Tdap 11/09/2010   We updated and reviewed the patient's past history in detail and it is documented below. Allergies: Patient is allergic to sulfa antibiotics, sulfa drugs cross reactors, simvastatin, penicillins, and lisinopril. Past Medical History Patient  has a past medical history of Arthritis, Hepatitis, Hyperlipemia, Hypertension, Hypothyroidism, Osteopenia (10/31/2013), and Thyroid disease. Past Surgical History Patient  has a past surgical history that includes I & D extremity (01/19/2012); Appendectomy; Hand surgery (2013); Vaginal hysterectomy; Tonsillectomy and adenoidectomy; laparotomy; Inguinal hernia repair (Right, 02/04/2020); and Breast lumpectomy with radioactive seed localization (Left, 05/20/2021). Family History: Patient family history includes Alzheimer's disease in her mother; Breast cancer in her sister; Diabetes in her mother; Heart disease in her father and mother; Hyperlipidemia in her brother; Hypertension in her mother. Social History:  Patient  reports that she has never smoked. She has never used  smokeless tobacco. She reports current alcohol use. She reports that she does not use drugs.  Review of Systems: Constitutional: negative for fever or malaise Ophthalmic: negative for photophobia, double vision or loss of vision Cardiovascular: negative for chest pain, dyspnea on exertion, or new LE swelling Respiratory: negative for SOB or persistent cough Gastrointestinal: negative for abdominal pain, change in bowel habits or melena Genitourinary: negative for dysuria or gross hematuria, no abnormal uterine bleeding or disharge Musculoskeletal: negative for new gait disturbance or muscular weakness Integumentary: negative for new or persistent rashes, no breast lumps Neurological: negative for TIA or stroke  symptoms Psychiatric: negative for SI or delusions Allergic/Immunologic: negative for hives  Patient Care Team    Relationship Specialty Notifications Start End  Leamon Arnt, MD PCP - General Family Medicine  01/10/18   Dermatology, [facility]    09/28/18   Delsa Sale, OD Consulting Physician Optometry  10/05/19     Objective  Vitals: BP (!) 150/90   Pulse 73   Temp 97.9 F (36.6 C) (Temporal)   Ht 5\' 5"  (1.651 m)   Wt 132 lb 6.4 oz (60.1 kg)   SpO2 99%   BMI 22.03 kg/m  General:  Well developed, well nourished, no acute distress  Psych:  Alert and orientedx3,normal mood and affect HEENT:  Normocephalic, atraumatic, non-icteric sclera,  supple neck without adenopathy, mass or thyromegaly Cardiovascular:  Normal S1, S2, RRR without gallop, rub or murmur Respiratory:  Good breath sounds bilaterally, CTAB with normal respiratory effort Gastrointestinal: normal bowel sounds, soft, non-tender, no noted masses. No HSM MSK: no deformities, contusions. Joints are without erythema or swelling.  Skin:  Warm, no rashes or suspicious lesions noted Neurologic:    Mental status is normal. Normal gait. No tremor   Commons side effects, risks, benefits, and alternatives for medications and treatment plan prescribed today were discussed, and the patient expressed understanding of the given instructions. Patient is instructed to call or message via MyChart if he/she has any questions or concerns regarding our treatment plan. No barriers to understanding were identified. We discussed Red Flag symptoms and signs in detail. Patient expressed understanding regarding what to do in case of urgent or emergency type symptoms.  Medication list was reconciled, printed and provided to the patient in AVS. Patient instructions and summary information was reviewed with the patient as documented in the AVS. This note was prepared with assistance of Dragon voice recognition software. Occasional wrong-word or  sound-a-like substitutions may have occurred due to the inherent limitations of voice recognition software  This visit occurred during the SARS-CoV-2 public health emergency.  Safety protocols were in place, including screening questions prior to the visit, additional usage of staff PPE, and extensive cleaning of exam room while observing appropriate contact time as indicated for disinfecting solutions.

## 2021-06-16 LAB — LIPID PANEL
Cholesterol: 156 mg/dL (ref 0–200)
HDL: 67.7 mg/dL (ref >39.00)
LDL Cholesterol: 74 mg/dL (ref 0–99)
NonHDL: 87.96
Total CHOL/HDL Ratio: 2
Triglycerides: 70 mg/dL (ref 0.0–149.0)
VLDL: 14 mg/dL (ref 0.0–40.0)

## 2021-06-16 LAB — CBC WITH DIFFERENTIAL/PLATELET
Basophils Absolute: 0.1 10*3/uL (ref 0.0–0.1)
Basophils Relative: 0.8 % (ref 0.0–3.0)
Eosinophils Absolute: 0.1 10*3/uL (ref 0.0–0.7)
Eosinophils Relative: 2.2 % (ref 0.0–5.0)
HCT: 39.1 % (ref 36.0–46.0)
Hemoglobin: 13 g/dL (ref 12.0–15.0)
Lymphocytes Relative: 20.8 % (ref 12.0–46.0)
Lymphs Abs: 1.4 10*3/uL (ref 0.7–4.0)
MCHC: 33.3 g/dL (ref 30.0–36.0)
MCV: 86.6 fl (ref 78.0–100.0)
Monocytes Absolute: 0.4 10*3/uL (ref 0.1–1.0)
Monocytes Relative: 6.2 % (ref 3.0–12.0)
Neutro Abs: 4.8 10*3/uL (ref 1.4–7.7)
Neutrophils Relative %: 70 % (ref 43.0–77.0)
Platelets: 191 10*3/uL (ref 150.0–400.0)
RBC: 4.52 Mil/uL (ref 3.87–5.11)
RDW: 13.5 % (ref 11.5–15.5)
WBC: 6.8 10*3/uL (ref 4.0–10.5)

## 2021-06-16 LAB — COMPREHENSIVE METABOLIC PANEL
ALT: 13 U/L (ref 0–35)
AST: 20 U/L (ref 0–37)
Albumin: 4.3 g/dL (ref 3.5–5.2)
Alkaline Phosphatase: 53 U/L (ref 39–117)
BUN: 17 mg/dL (ref 6–23)
CO2: 28 mEq/L (ref 19–32)
Calcium: 9.6 mg/dL (ref 8.4–10.5)
Chloride: 102 mEq/L (ref 96–112)
Creatinine, Ser: 0.72 mg/dL (ref 0.40–1.20)
GFR: 82.26 mL/min (ref >60.00)
Glucose, Bld: 99 mg/dL (ref 70–99)
Potassium: 3.5 mEq/L (ref 3.5–5.1)
Sodium: 140 mEq/L (ref 135–145)
Total Bilirubin: 0.7 mg/dL (ref 0.2–1.2)
Total Protein: 7.1 g/dL (ref 6.0–8.3)

## 2021-06-16 LAB — TSH: TSH: 1.15 u[IU]/mL (ref 0.35–5.50)

## 2021-07-07 ENCOUNTER — Ambulatory Visit
Admission: RE | Admit: 2021-07-07 | Discharge: 2021-07-07 | Disposition: A | Discharge status: Home / Self Care | Payer: Medicare Other | Source: Ambulatory Visit | Attending: Family Medicine | Admitting: Family Medicine

## 2021-07-07 ENCOUNTER — Other Ambulatory Visit: Payer: Self-pay

## 2021-07-07 DIAGNOSIS — D1803 Hemangioma of intra-abdominal structures: Secondary | ICD-10-CM | POA: Diagnosis not present

## 2021-07-07 DIAGNOSIS — D7389 Other diseases of spleen: Secondary | ICD-10-CM

## 2021-07-07 DIAGNOSIS — R161 Splenomegaly, not elsewhere classified: Secondary | ICD-10-CM | POA: Diagnosis not present

## 2021-07-07 MED ORDER — GADOBENATE DIMEGLUMINE 529 MG/ML IV SOLN
12.0000 mL | Freq: Once | INTRAVENOUS | Status: AC | PRN
  Administered 2021-07-07: 12 mL via INTRAVENOUS

## 2021-07-13 ENCOUNTER — Encounter: Payer: Self-pay | Admitting: Family Medicine

## 2021-07-13 DIAGNOSIS — D1803 Hemangioma of intra-abdominal structures: Secondary | ICD-10-CM | POA: Insufficient documentation

## 2021-07-14 DIAGNOSIS — Z23 Encounter for immunization: Secondary | ICD-10-CM | POA: Diagnosis not present

## 2021-08-10 ENCOUNTER — Other Ambulatory Visit: Payer: Self-pay | Admitting: Surgery

## 2021-08-10 DIAGNOSIS — Z9889 Other specified postprocedural states: Secondary | ICD-10-CM

## 2021-08-10 DIAGNOSIS — Z1239 Encounter for other screening for malignant neoplasm of breast: Secondary | ICD-10-CM

## 2021-08-11 ENCOUNTER — Other Ambulatory Visit: Payer: Self-pay | Admitting: Family Medicine

## 2021-09-01 ENCOUNTER — Ambulatory Visit
Admission: RE | Admit: 2021-09-01 | Discharge: 2021-09-01 | Disposition: A | Discharge status: Home / Self Care | Payer: Medicare Other | Source: Ambulatory Visit | Attending: Surgery | Admitting: Surgery

## 2021-09-01 DIAGNOSIS — Z1239 Encounter for other screening for malignant neoplasm of breast: Secondary | ICD-10-CM | POA: Diagnosis not present

## 2021-09-01 DIAGNOSIS — Z803 Family history of malignant neoplasm of breast: Secondary | ICD-10-CM | POA: Diagnosis not present

## 2021-09-01 DIAGNOSIS — Z9889 Other specified postprocedural states: Secondary | ICD-10-CM

## 2021-09-01 MED ORDER — GADOBUTROL 1 MMOL/ML IV SOLN
6.0000 mL | Freq: Once | INTRAVENOUS | Status: AC | PRN
  Administered 2021-09-01: 6 mL via INTRAVENOUS

## 2021-09-11 DIAGNOSIS — K136 Irritative hyperplasia of oral mucosa: Secondary | ICD-10-CM | POA: Diagnosis not present

## 2021-09-11 DIAGNOSIS — K1370 Unspecified lesions of oral mucosa: Secondary | ICD-10-CM | POA: Diagnosis not present

## 2021-09-11 HISTORY — PX: MOUTH SURGERY: SHX715

## 2021-10-23 ENCOUNTER — Telehealth: Payer: Self-pay | Admitting: Family Medicine

## 2021-10-23 NOTE — Telephone Encounter (Signed)
..  Type of form received: Pre-Departure Medical Form ? ?Additional comments:  ? ?Received by: Adonis Brook  ? ?Form should be Faxed to: ? ?Form should be mailed to:   ? ?Is patient requesting call for pickup: yes, [phone] ? ? ?Form placed:  In provider's box ? ?Attach charge sheet. yes ? ?Individual made aware of 3-5 business day turn around (Y/N)? yes ?  ?

## 2021-10-29 NOTE — Telephone Encounter (Signed)
Pt called in to check on paperwork. Will call back next week. ?

## 2021-10-30 DIAGNOSIS — H2513 Age-related nuclear cataract, bilateral: Secondary | ICD-10-CM | POA: Diagnosis not present

## 2021-10-30 DIAGNOSIS — H35033 Hypertensive retinopathy, bilateral: Secondary | ICD-10-CM | POA: Diagnosis not present

## 2021-10-30 DIAGNOSIS — I10 Essential (primary) hypertension: Secondary | ICD-10-CM | POA: Diagnosis not present

## 2021-10-30 DIAGNOSIS — H52223 Regular astigmatism, bilateral: Secondary | ICD-10-CM | POA: Diagnosis not present

## 2021-10-30 DIAGNOSIS — H524 Presbyopia: Secondary | ICD-10-CM | POA: Diagnosis not present

## 2021-10-30 DIAGNOSIS — H5203 Hypermetropia, bilateral: Secondary | ICD-10-CM | POA: Diagnosis not present

## 2021-10-30 NOTE — Telephone Encounter (Signed)
Form has been completed, and placed at the front for pick up. Pt is aware. ?

## 2021-11-09 ENCOUNTER — Ambulatory Visit: Payer: Medicare Other

## 2021-11-24 ENCOUNTER — Telehealth: Payer: Self-pay | Admitting: Family Medicine

## 2021-11-24 NOTE — Telephone Encounter (Signed)
Copied from [place] [id number]. Topic: Medicare AWV ?>> Nov 24, 2021 11:18 AM Harris-Coley, Hannah Beat wrote: ?Reason for CRM: LVM 11/24/21 AWV appt time change from 10:15 to 2pm.  Please confirm khc ?

## 2021-12-04 ENCOUNTER — Ambulatory Visit (INDEPENDENT_AMBULATORY_CARE_PROVIDER_SITE_OTHER): Payer: Medicare Other

## 2021-12-04 ENCOUNTER — Ambulatory Visit: Payer: Medicare Other

## 2021-12-04 DIAGNOSIS — Z0000 Encounter for general adult medical examination without abnormal findings: Secondary | ICD-10-CM | POA: Diagnosis not present

## 2021-12-04 NOTE — Progress Notes (Signed)
Virtual Visit via Telephone Note ? ?I connected with  Kristi Jones on 12/04/21 at  2:00 PM EDT by telephone and verified that I am speaking with the correct person using two identifiers. ? ?Medicare Annual Wellness visit completed telephonically due to Covid-19 pandemic.  ? ?Persons participating in this call: This Health Coach and this patient.  ? ?Location: ?Patient: Home ?Provider: Office  ?  ?I discussed the limitations, risks, security and privacy concerns of performing an evaluation and management service by telephone and the availability of in person appointments. The patient expressed understanding and agreed to proceed. ? ?Unable to perform video visit due to video visit attempted and failed and/or patient does not have video capability.  ? ?Some vital signs may be absent or patient reported.  ? ?Willette Brace, LPN ? ? ?Subjective:  ? Kristi Jones is a 76 y.o. female who presents for Medicare Annual (Subsequent) preventive examination. ? ?Review of Systems    ? ?Cardiac Risk Factors include: advanced age (>1mn, >>3women);hypertension;dyslipidemia ? ?   ?Objective:  ?  ?There were no vitals filed for this visit. ?There is no height or weight on file to calculate BMI. ? ? ?  05/20/2021  ?  7:21 AM 05/14/2021  ? 12:46 PM 11/03/2020  ? 11:20 AM 02/01/2020  ?  8:07 AM 01/29/2020  ?  9:56 PM 10/05/2019  ? 10:19 AM 09/28/2018  ?  2:51 PM  ?Advanced Directives  ?Does Patient Have a Medical Advance Directive? Yes Yes Yes No;Yes No Yes Yes  ?Type of ACorporate treasurerof [place] will [place] will   Living will;Healthcare Power of [place] will  ?Does patient want to make changes to medical advance directive? No - Patient declined No - Patient declined    No - Patient declined   ?Copy of [place]?  Yes - validated most recent copy scanned in chart (See row information) Yes - validated most  recent copy scanned in chart (See row information)   Yes - validated most recent copy scanned in chart (See row information) No - copy requested  ?Would patient like information on creating a medical advance directive? No - Patient declined    No - Guardian declined    ? ? ?Current Medications (verified) ?Outpatient Encounter Medications as of 12/04/2021  ?Medication Sig  ? folic acid (FOLVITE) 1 MG tablet TAKE 1 TABLET ONCE DAILY.  ? levothyroxine (SYNTHROID) 88 MCG tablet Take 1 tablet (88 mcg total) by mouth daily before breakfast.  ? losartan-hydrochlorothiazide (HYZAAR) 100-25 MG tablet Take 1 tablet by mouth daily.  ? rosuvastatin (CRESTOR) 5 MG tablet TAKE ONE TABLET AT BEDTIME.  ? SHINGRIX injection   ? ?No facility-administered encounter medications on file as of 12/04/2021.  ? ? ?Allergies (verified) ?Sulfa antibiotics, Sulfa drugs cross reactors, Simvastatin, Penicillins, and Lisinopril  ? ?History: ?Past Medical History:  ?Diagnosis Date  ? Arthritis   ? hands   ? Hepatitis   ? hx of Hep A as a child   ? Hyperlipemia   ? Hypertension   ? Hypothyroidism   ? Osteopenia 10/31/2013  ? DEXA 01/2013- T+ -2.3 with elvated FRAX of 20.9%, rec consideration of [place] Party patient declines now 04/2014/[facility]  ? Thyroid disease   ? ?Past Surgical History:  ?Procedure Laterality Date  ? APPENDECTOMY    ? BREAST LUMPECTOMY WITH RADIOACTIVE SEED LOCALIZATION Left 05/20/2021  ? Procedure: LEFT BREAST LUMPECTOMY  WITH RADIOACTIVE SEED LOCALIZATION;  Surgeon: Erroll Luna, MD;  Location: [facility];  Service: General;  Laterality: Left;  ? HAND SURGERY  2013  ? left hand due to trauma  ? I & D EXTREMITY  01/19/2012  ? Procedure: IRRIGATION AND DEBRIDEMENT EXTREMITY;  Surgeon: Schuyler Amor, MD;  Location: [facility];  Service: Orthopedics;  Laterality: Left;  with common digital artery repair  ? INGUINAL HERNIA REPAIR Right 02/04/2020  ? Procedure: OPEN RIGHT INGUINAL HERNIA REPAIR;  Surgeon: Alphonsa Overall, MD;   Location: [facility];  Service: General;  Laterality: Right;  ? LAPAROTOMY    ? for endometriosis in 20s   ? MOUTH SURGERY  09/11/2021  ? irritation fibromas removed by Dr Benson Norway per pt  ? TONSILLECTOMY AND ADENOIDECTOMY    ? VAGINAL HYSTERECTOMY    ? vaginal, partial, DUB   ? ?Family History  ?Problem Relation Age of Onset  ? Alzheimer's disease Mother   ? Diabetes Mother   ? Heart disease Mother   ?     CHF   ? Hypertension Mother   ? Heart disease Father   ? Breast cancer Sister   ? Hyperlipidemia Brother   ? ?Social History  ? ?Socioeconomic History  ? Marital status: Married  ?  Spouse name: Not on file  ? Number of children: 3  ? Years of education: 31  ? Highest education level: Not on file  ?Occupational History  ? 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  ?  Employer: [employer]  ?Tobacco Use  ? Smoking status: Never  ? Smokeless tobacco: Never  ?Vaping Use  ? Vaping Use: Never used  ?Substance and Sexual Activity  ? Alcohol use: Yes  ?  Comment: wine and beer occas   ? Drug use: No  ? Sexual activity: Yes  ?Other Topics Concern  ? Not on file  ?Social History Narrative  ? Physically active with classes at gym   ? ?Social Determinants of Health  ? ?Financial Resource Strain: Low Risk   ? Difficulty of Paying Living Expenses: Not hard at all  ?Food Insecurity: No Food Insecurity  ? Worried About Charity fundraiser in the Last Year: Never true  ? Ran Out of Food in the Last Year: Never true  ?Transportation Needs: No Transportation Needs  ? Lack of Transportation (Medical): No  ? Lack of Transportation (Non-Medical): No  ?Physical Activity: Sufficiently Active  ? Days of Exercise per Week: 5 days  ? Minutes of Exercise per Session: 60 min  ?Stress: No Stress Concern Present  ? Feeling of Stress : Not at all  ?Social Connections: Moderately Integrated  ? Frequency of Communication with Friends and Family: More than three times a week  ? Frequency of Social Gatherings with Friends and Family: More than three times a week  ? Attends  Religious Services: More than 4 times per year  ? Active Member of Clubs or Organizations: No  ? Attends Archivist Meetings: Never  ? Marital Status: Married  ? ? ?Tobacco Counseling ?Counseling given: Not Answered ? ? ?Clinical Intake: ? ?Pre-visit preparation completed: Yes ? ?Pain : No/denies pain ? ?  ? ?BMI - recorded: 22.03 ?Nutritional Status: BMI of 19-24  Normal ?Nutritional Risks: None ?Diabetes: No ? ?How often do you need to have someone help you when you read instructions, pamphlets, or other written materials from your doctor or pharmacy?: 1 - Never ? ?Diabetic?no ? ?Interpreter Needed?: No ? ?Information entered by ::  Charlott Rakes, LPN ? ? ?Activities of Daily Living ? ?  12/04/2021  ?  2:10 PM 05/20/2021  ?  7:26 AM  ?In your present state of health, do you have any difficulty performing the following activities:  ?Hearing? 0 0  ?Vision? 0 0  ?Difficulty concentrating or making decisions? 0 0  ?Walking or climbing stairs? 0 0  ?Dressing or bathing? 0 0  ?Doing errands, shopping? 0   ?Preparing Food and eating ? N   ?Using the Toilet? N   ?In the past six months, have you accidently leaked urine? N   ?Do you have problems with loss of bowel control? N   ?Managing your Medications? N   ?Managing your Finances? N   ?Housekeeping or managing your Housekeeping? N   ? ? ?Patient Care Team: ?Leamon Arnt, MD as PCP - General (Family Medicine) ?Dermatology, [facility] ?Delsa Sale, OD as Consulting Physician (Optometry) ? ?Indicate any recent Medical Services you may have received from other than [facility] providers in the past year (date may be approximate). ? ?   ?Assessment:  ? This is a routine wellness examination for Zelia. ? ?Hearing/Vision screen ?Hearing Screening - Comments:: Pt denies any hearing issus  ?Vision Screening - Comments:: Pt follows up with dr Sabra Heck for annual eye exams  ? ?Dietary issues and exercise activities discussed: ?  ? ? Goals Addressed   ? ?  ?  ?  ?  ? This  Visit's Progress  ?  Patient Stated     ?  None at this time  ?  ? ?  ? ?Depression Screen ? ?  12/04/2021  ?  2:06 PM 11/03/2020  ? 11:18 AM 05/09/2020  ? 10:02 AM 10/05/2019  ? 10:20 AM 09/28/2018  ?  2:52 PM

## 2021-12-04 NOTE — Patient Instructions (Signed)
Ms. Capps , ?Thank you for taking time to come for your Medicare Wellness Visit. I appreciate your ongoing commitment to your health goals. Please review the following plan we discussed and let me know if I can assist you in the future.  ? ?Screening recommendations/referrals: ?Colonoscopy: Done 06/16/12 repeat every 10 years  ?Mammogram: Done 8/1//22 repeat every year  ?Bone Density: Done 03/04/21 repeat every 2 years  ?Recommended yearly ophthalmology/optometry visit for glaucoma screening and checkup ?Recommended yearly dental visit for hygiene and checkup ? ?Vaccinations: ?Influenza vaccine: Done 06/12/21 repeat every year  ?Tdap vaccine: Discontinued  ?Shingles vaccine: Completed 06/17/21 & 09/14/21   ?Covid-19:Completed 1/13, 2/11, 07/29/20 & 03/10/21 , 07/14/21 ? ?Advanced directives: Copies in chart  ? ?Conditions/risks identified: None at this time ? ?Next appointment: Follow up in one year for your annual wellness visit  ? ? ? ?Preventive Care 38 Years and Older, Female ?Preventive care refers to lifestyle choices and visits with your health care provider that can promote health and wellness. ?What does preventive care include? ?A yearly physical exam. This is also called an annual well check. ?Dental exams once or twice a year. ?Routine eye exams. Ask your health care provider how often you should have your eyes checked. ?Personal lifestyle choices, including: ?Daily care of your teeth and gums. ?Regular physical activity. ?Eating a healthy diet. ?Avoiding tobacco and drug use. ?Limiting alcohol use. ?Practicing safe sex. ?Taking low-dose aspirin every day. ?Taking vitamin and mineral supplements as recommended by your health care provider. ?What happens during an annual well check? ?The services and screenings done by your health care provider during your annual well check will depend on your age, overall health, lifestyle risk factors, and family history of disease. ?Counseling  ?Your health care provider  may ask you questions about your: ?Alcohol use. ?Tobacco use. ?Drug use. ?Emotional well-being. ?Home and relationship well-being. ?Sexual activity. ?Eating habits. ?History of falls. ?Memory and ability to understand (cognition). ?Work and work Statistician. ?Reproductive health. ?Screening  ?You may have the following tests or measurements: ?Height, weight, and BMI. ?Blood pressure. ?Lipid and cholesterol levels. These may be checked every 5 years, or more frequently if you are over 88 years old. ?Skin check. ?Lung cancer screening. You may have this screening every year starting at age 13 if you have a 30-pack-year history of smoking and currently smoke or have quit within the past 15 years. ?Fecal occult blood test (FOBT) of the stool. You may have this test every year starting at age 23. ?Flexible sigmoidoscopy or colonoscopy. You may have a sigmoidoscopy every 5 years or a colonoscopy every 10 years starting at age 1. ?Hepatitis C blood test. ?Hepatitis B blood test. ?Sexually transmitted disease (STD) testing. ?Diabetes screening. This is done by checking your blood sugar (glucose) after you have not eaten for a while (fasting). You may have this done every 1-3 years. ?Bone density scan. This is done to screen for osteoporosis. You may have this done starting at age 71. ?Mammogram. This may be done every 1-2 years. Talk to your health care provider about how often you should have regular mammograms. ?Talk with your health care provider about your test results, treatment options, and if necessary, the need for more tests. ?Vaccines  ?Your health care provider may recommend certain vaccines, such as: ?Influenza vaccine. This is recommended every year. ?Tetanus, diphtheria, and acellular pertussis (Tdap, Td) vaccine. You may need a Td booster every 10 years. ?Zoster vaccine. You may need this after  age 20. ?Pneumococcal 13-valent conjugate (PCV13) vaccine. One dose is recommended after age 56. ?Pneumococcal  polysaccharide (PPSV23) vaccine. One dose is recommended after age 32. ?Talk to your health care provider about which screenings and vaccines you need and how often you need them. ?This information is not intended to replace advice given to you by your health care provider. Make sure you discuss any questions you have with your health care provider. ?Document Released: 09/05/2015 Document Revised: 04/28/2016 Document Reviewed: 06/10/2015 ?Elsevier Interactive Patient Education ? 2017 [place]. ? ?Fall Prevention in the Home ?Falls can cause injuries. They can happen to people of all ages. There are many things you can do to make your home safe and to help prevent falls. ?What can I do on the outside of my home? ?Regularly fix the edges of walkways and driveways and fix any cracks. ?Remove anything that might make you trip as you walk through a door, such as a raised step or threshold. ?Trim any bushes or trees on the path to your home. ?Use bright outdoor lighting. ?Clear any walking paths of anything that might make someone trip, such as rocks or tools. ?Regularly check to see if handrails are loose or broken. Make sure that both sides of any steps have handrails. ?Any raised decks and porches should have guardrails on the edges. ?Have any leaves, snow, or ice cleared regularly. ?Use sand or salt on walking paths during winter. ?Clean up any spills in your garage right away. This includes oil or grease spills. ?What can I do in the bathroom? ?Use night lights. ?Install grab bars by the toilet and in the tub and shower. Do not use towel bars as grab bars. ?Use non-skid mats or decals in the tub or shower. ?If you need to sit down in the shower, use a plastic, non-slip stool. ?Keep the floor dry. Clean up any water that spills on the floor as soon as it happens. ?Remove soap buildup in the tub or shower regularly. ?Attach bath mats securely with double-sided non-slip rug tape. ?Do not have throw rugs and other  things on the floor that can make you trip. ?What can I do in the bedroom? ?Use night lights. ?Make sure that you have a light by your bed that is easy to reach. ?Do not use any sheets or blankets that are too big for your bed. They should not hang down onto the floor. ?Have a firm chair that has side arms. You can use this for support while you get dressed. ?Do not have throw rugs and other things on the floor that can make you trip. ?What can I do in the kitchen? ?Clean up any spills right away. ?Avoid walking on wet floors. ?Keep items that you use a lot in easy-to-reach places. ?If you need to reach something above you, use a strong step stool that has a grab bar. ?Keep electrical cords out of the way. ?Do not use floor polish or wax that makes floors slippery. If you must use wax, use non-skid floor wax. ?Do not have throw rugs and other things on the floor that can make you trip. ?What can I do with my stairs? ?Do not leave any items on the stairs. ?Make sure that there are handrails on both sides of the stairs and use them. Fix handrails that are broken or loose. Make sure that handrails are as long as the stairways. ?Check any carpeting to make sure that it is firmly attached to the stairs.  Fix any carpet that is loose or worn. ?Avoid having throw rugs at the top or bottom of the stairs. If you do have throw rugs, attach them to the floor with carpet tape. ?Make sure that you have a light switch at the top of the stairs and the bottom of the stairs. If you do not have them, ask someone to add them for you. ?What else can I do to help prevent falls? ?Wear shoes that: ?Do not have high heels. ?Have rubber bottoms. ?Are comfortable and fit you well. ?Are closed at the toe. Do not wear sandals. ?If you use a stepladder: ?Make sure that it is fully opened. Do not climb a closed stepladder. ?Make sure that both sides of the stepladder are locked into place. ?Ask someone to hold it for you, if possible. ?Clearly  mark and make sure that you can see: ?Any grab bars or handrails. ?First and last steps. ?Where the edge of each step is. ?Use tools that help you move around (mobility aids) if they are needed. These includ

## 2021-12-07 ENCOUNTER — Encounter: Payer: Self-pay | Admitting: Family Medicine

## 2021-12-09 ENCOUNTER — Encounter (HOSPITAL_COMMUNITY): Payer: Self-pay

## 2021-12-14 ENCOUNTER — Ambulatory Visit: Payer: Medicare Other | Admitting: Family Medicine

## 2021-12-16 ENCOUNTER — Ambulatory Visit (INDEPENDENT_AMBULATORY_CARE_PROVIDER_SITE_OTHER): Payer: Medicare Other | Admitting: Family Medicine

## 2021-12-16 ENCOUNTER — Encounter: Payer: Self-pay | Admitting: Family Medicine

## 2021-12-16 VITALS — BP 136/78 | HR 69 | Temp 98.6°F | Ht 65.0 in | Wt 130.4 lb

## 2021-12-16 DIAGNOSIS — I10 Essential (primary) hypertension: Secondary | ICD-10-CM

## 2021-12-16 DIAGNOSIS — M810 Age-related osteoporosis without current pathological fracture: Secondary | ICD-10-CM

## 2021-12-16 NOTE — Progress Notes (Signed)
? ? ?Subjective  ?CC:  ?Chief Complaint  ?Patient presents with  ? Hypertension  ?  Pt here for a 8mo F/U with Bp. Stated that she has been monitoring it t home and it has been fine.  ? ? ?HPI: Kristi KASTENSis a 76y.o. female who presents to the office today to address the problems listed above in the chief complaint. ?Hypertension f/u: I adjusted up doses of meds 6 months ago. Tolerating well. Control is now good . Pt reports she is doing well. taking medications as instructed, no medication side effects noted, no TIAs, no chest pain on exertion, no dyspnea on exertion, no swelling of ankles. She denies adverse effects from his BP medications. Compliance with medication is good.  Home average 120 over 70s ?Osteoporosis: On drug holiday.  Exercises regularly and now exercising 5-6 times weekly.  Mostly cardio, some resistance training.  Weight is trending downward.  Eating well. ? ?Assessment  ?1. HTN (hypertension), benign   ?2. Age-related osteoporosis without current pathological fracture   ? ?  ?Plan  ? ?Hypertension f/u: BP control is well controlled. Continue hyzaar 100/25 daily ?Osteoporosis f/u: Discussed maintaining good bone health with weight bearing exercises.  Monitor weight.  Make sure she is getting good nutrition.  Would avoid further weight loss.  Patient agrees ? ?Education regarding management of these chronic disease states was given. Management strategies discussed on successive visits include dietary and exercise recommendations, goals of achieving and maintaining IBW, and lifestyle modifications aiming for adequate sleep and minimizing stressors.  ? ?Follow up: 6 months for complete physical ? ?No orders of the defined types were placed in this encounter. ? ?No orders of the defined types were placed in this encounter. ? ?  ? ?BP Readings from Last 3 Encounters:  ?12/16/21 136/78  ?06/15/21 (!) 150/90  ?05/20/21 138/78  ? ?Wt Readings from Last 3 Encounters:  ?12/16/21 130 lb 6.4 oz  (59.1 kg)  ?06/15/21 132 lb 6.4 oz (60.1 kg)  ?05/20/21 132 lb 7.9 oz (60.1 kg)  ? ? ?Lab Results  ?Component Value Date  ? CHOL 156 06/15/2021  ? CHOL 164 05/09/2020  ? CHOL 144 01/24/2019  ? ?Lab Results  ?Component Value Date  ? HDL 67.70 06/15/2021  ? HDL 71 05/09/2020  ? HDL 60.40 01/24/2019  ? ?Lab Results  ?Component Value Date  ? LB[place]74 06/15/2021  ? LO[place]75 05/09/2020  ? L[place]68 01/24/2019  ? ?Lab Results  ?Component Value Date  ? TRIG 70.0 06/15/2021  ? TRIG 99 05/09/2020  ? TRIG 75.0 01/24/2019  ? ?Lab Results  ?Component Value Date  ? CHOLHDL 2 06/15/2021  ? CHOLHDL 2.3 05/09/2020  ? CHOLHDL 2 01/24/2019  ? ?No results found for: LDLDIRECT ?Lab Results  ?Component Value Date  ? CREATININE 0.72 06/15/2021  ? BUN 17 06/15/2021  ? NA 140 06/15/2021  ? K 3.5 06/15/2021  ? CL 102 06/15/2021  ? CO2 28 06/15/2021  ? ? ?The 10-year ASCVD risk score (Arnett DK, et al., 2019) is: 32% ?  Values used to calculate the score: ?    Age: [age]years ?    Sex: Female ?    Is Non-Hispanic African American: No ?    Diabetic: No ?    Tobacco smoker: Yes ?    Systolic Blood Pressure: 1478mmHg ?    Is BP treated: Yes ?    HDL Cholesterol: 67.7 mg/dL ?    Total Cholesterol:  156 mg/dL ? ?I reviewed the patients updated PMH, FH, and SocHx.  ?  ?Patient Active Problem List  ? Diagnosis Date Noted  ? Hyperlipidemia 05/10/2014  ?  Priority: High  ? HTN (hypertension), benign 10/31/2013  ?  Priority: High  ? Hypothyroidism 10/31/2013  ?  Priority: High  ? Breast fibroadenoma, left 11/08/2014  ?  Priority: Medium   ? Age-related osteoporosis without current pathological fracture 10/31/2013  ?  Priority: Medium   ? Sebaceous cyst, upper back 06/15/2012  ?  Priority: Low  ? Intraductal papilloma of breast, left 06/15/2021  ?  Priority: Medium   ? Atypical ductal hyperplasia of left breast 06/15/2021  ?  Priority: Medium   ? Hemangioma of spleen 07/13/2021  ? Pneumococcal vaccine refused 01/24/2019  ? ? ?Allergies: Sulfa  antibiotics, Sulfa drugs cross reactors, Simvastatin, Penicillins, and Lisinopril ? ?Social History: ?Patient  reports that she has never smoked. She has never used smokeless tobacco. She reports current alcohol use. She reports that she does not use drugs. ? ?Current Meds  ?Medication Sig  ? folic acid (FOLVITE) 1 MG tablet TAKE 1 TABLET ONCE DAILY.  ? levothyroxine (SYNTHROID) 88 MCG tablet Take 1 tablet (88 mcg total) by mouth daily before breakfast.  ? losartan-hydrochlorothiazide (HYZAAR) 100-25 MG tablet Take 1 tablet by mouth daily.  ? rosuvastatin (CRESTOR) 5 MG tablet TAKE ONE TABLET AT BEDTIME.  ? [DISCONTINUED] SHINGRIX injection   ? ? ?Review of Systems: ?Cardiovascular: negative for chest pain, palpitations, leg swelling, orthopnea ?Respiratory: negative for SOB, wheezing or persistent cough ?Gastrointestinal: negative for abdominal pain ?Genitourinary: negative for dysuria or gross hematuria ? ?Objective  ?Vitals: BP 136/78   Pulse 69   Temp 98.6 ?F (37 ?C)   Ht '5\' 5"'$  (1.651 m)   Wt 130 lb 6.4 oz (59.1 kg)   SpO2 98%   BMI 21.70 kg/m?  ?General: no acute distress   ?Cardiovascular:  RRR without murmur. no edema ?Respiratory:  Good breath sounds bilaterally, CTAB with normal respiratory effort ? ?Commons side effects, risks, benefits, and alternatives for medications and treatment plan prescribed today were discussed, and the patient expressed understanding of the given instructions. Patient is instructed to call or message via MyChart if he/she has any questions or concerns regarding our treatment plan. No barriers to understanding were identified. We discussed Red Flag symptoms and signs in detail. Patient expressed understanding regarding what to do in case of urgent or emergency type symptoms.  ?Medication list was reconciled, printed and provided to the patient in AVS. Patient instructions and summary information was reviewed with the patient as documented in the AVS. ?This note was prepared  with assistance of Systems analyst. Occasional wrong-word or sound-a-like substitutions may have occurred due to the inherent limitations of voice recognition software ? ?This visit occurred during the SARS-CoV-2 public health emergency.  Safety protocols were in place, including screening questions prior to the visit, additional usage of staff PPE, and extensive cleaning of exam room while observing appropriate contact time as indicated for disinfecting solutions.  ?

## 2022-01-05 ENCOUNTER — Encounter (HOSPITAL_COMMUNITY): Payer: Self-pay | Admitting: Emergency Medicine

## 2022-01-05 ENCOUNTER — Ambulatory Visit (HOSPITAL_COMMUNITY)
Admission: EM | Admit: 2022-01-05 | Discharge: 2022-01-05 | Disposition: A | Discharge status: Home / Self Care | Payer: Medicare Other | Attending: Family Medicine | Admitting: Family Medicine

## 2022-01-05 ENCOUNTER — Ambulatory Visit (INDEPENDENT_AMBULATORY_CARE_PROVIDER_SITE_OTHER): Payer: Medicare Other

## 2022-01-05 DIAGNOSIS — S61252A Open bite of right middle finger without damage to nail, initial encounter: Secondary | ICD-10-CM | POA: Diagnosis not present

## 2022-01-05 DIAGNOSIS — Z23 Encounter for immunization: Secondary | ICD-10-CM

## 2022-01-05 DIAGNOSIS — W540XXA Bitten by dog, initial encounter: Secondary | ICD-10-CM

## 2022-01-05 DIAGNOSIS — M79644 Pain in right finger(s): Secondary | ICD-10-CM

## 2022-01-05 MED ORDER — TETANUS-DIPHTH-ACELL PERTUSSIS 5-2.5-18.5 LF-MCG/0.5 IM SUSY
0.5000 mL | PREFILLED_SYRINGE | Freq: Once | INTRAMUSCULAR | Status: AC
  Administered 2022-01-05: 0.5 mL via INTRAMUSCULAR

## 2022-01-05 MED ORDER — AMOXICILLIN-POT CLAVULANATE 875-125 MG PO TABS: 1.0000 | ORAL_TABLET | Freq: Two times a day (BID) | ORAL | 0 refills | Status: DC

## 2022-01-05 MED ORDER — TETANUS-DIPHTH-ACELL PERTUSSIS 5-2.5-18.5 LF-MCG/0.5 IM SUSY
PREFILLED_SYRINGE | INTRAMUSCULAR | Status: AC
  Filled 2022-01-05: qty 0.5

## 2022-01-05 NOTE — Discharge Instructions (Addendum)
Meds ordered this encounter  ?Medications  ? Tdap (BOOSTRIX) injection 0.5 mL  ? amoxicillin-clavulanate (AUGMENTIN) 875-125 MG tablet  ?  Sig: Take 1 tablet by mouth every 12 (twelve) hours.  ?  Dispense:  14 tablet  ?  Refill:  0  ? ? ?

## 2022-01-05 NOTE — ED Triage Notes (Signed)
Pt reports right middle finger injury after breaking up fight between her two dogs. States this injury happened around 12:30 pm. States she first thought the bite was a puncture wound, now believes it may be a crush injury. Bleeding is controlled. ?

## 2022-01-06 NOTE — ED Provider Notes (Signed)
?[facility] ? ? ?[id number] ?01/05/22 Arrival Time: 7782 ? ?ASSESSMENT & PLAN: ? ?1. Finger pain, right   ?2. Dog bite, initial encounter   ? ?I have personally viewed the imaging studies ordered this visit. ?No fractures or radio-opaque FB appreciated. ? ?Discussed wound care. ?Begin: ?Discharge Medication List as of 01/05/2022  6:29 PM  ?  ? ?START taking these medications  ? Details  ?amoxicillin-clavulanate (AUGMENTIN) 875-125 MG tablet Take 1 tablet by mouth every 12 (twelve) hours., Starting Tue 01/05/2022, Normal  ?  ?  ? ?Tdap given. ? ?Orders Placed This Encounter  ?Procedures  ? DG Finger Middle Right  ? Apply dressing  ? ? ?Recommend: ? Follow-up Information   ? ? Leamon Arnt, MD.   ?Specialty: Family Medicine ?Why: As needed. ?Contact information: ?[address] ?[place] [address] ?[phone] ? ? ?  ?  ? ? [facility].   ?Specialty: Urgent Care ?Why: If worsening or failing to improve as anticipated. ?Contact information: ?[address] ?[place] [address] ?[phone] ? ?  ?  ? ?  ?  ? ?  ? ?Reviewed expectations re: course of current medical issues. Questions answered. ?Outlined signs and symptoms indicating need for more acute intervention. ?Patient verbalized understanding. ?After Visit Summary given. ? ?SUBJECTIVE: ?History from: patient. ?Kristi Jones is a 76 y.o. female who reports breaking up fight between her dogs; today; puncture wound to RIGHT 3rd distal finger; flushed with water at home. Swollen and bruised; is painful. No drainage. Bleeding controlled.  ? ?Needs Td update. ? ?Past Surgical History:  ?Procedure Laterality Date  ? APPENDECTOMY    ? BREAST LUMPECTOMY WITH RADIOACTIVE SEED LOCALIZATION Left 05/20/2021  ? Procedure: LEFT BREAST LUMPECTOMY WITH RADIOACTIVE SEED LOCALIZATION;  Surgeon: Erroll Luna, MD;  Location: [place];  Service: General;  Laterality: Left;  ? HAND SURGERY  2013   ? left hand due to trauma  ? I & D EXTREMITY  01/19/2012  ? Procedure: IRRIGATION AND DEBRIDEMENT EXTREMITY;  Surgeon: Schuyler Amor, MD;  Location: [place];  Service: Orthopedics;  Laterality: Left;  with common digital artery repair  ? INGUINAL HERNIA REPAIR Right 02/04/2020  ? Procedure: OPEN RIGHT INGUINAL HERNIA REPAIR;  Surgeon: Alphonsa Overall, MD;  Location: [facility];  Service: General;  Laterality: Right;  ? LAPAROTOMY    ? for endometriosis in 20s   ? MOUTH SURGERY  09/11/2021  ? irritation fibromas removed by Dr Benson Norway per pt  ? TONSILLECTOMY AND ADENOIDECTOMY    ? VAGINAL HYSTERECTOMY    ? vaginal, partial, DUB   ?  ? ? ?OBJECTIVE: ? ?Vitals:  ? 01/05/22 1741 01/05/22 1742  ?BP:  (!) 147/78  ?Pulse:  73  ?Resp:  16  ?Temp:  98 ?F (36.7 ?C)  ?TempSrc:  Oral  ?SpO2:  100%  ?Weight: 59.1 kg   ?Height: '5\' 5"'$  (1.651 m)   ?  ?General appearance: alert; no distress ?HEENT: [place]; AT ?Neck: supple with FROM ?Resp: unlabored respirations ?Extremities: ?RUE: warm with well perfused appearance; sub-cm puncture wound of pad of distal R 3rd finger; nail intact; is bruised and TTP; normal cap refill and distal sensation; FROM ?Psychological: alert and cooperative; normal mood and affect ? ?Imaging: ?DG Finger Middle Right ? ?Result Date: 01/05/2022 ?CLINICAL DATA:  Dog bite EXAM: RIGHT MIDDLE FINGER 2+V COMPARISON:  None Available. FINDINGS: No acute fracture. Alignment is anatomic. Changes of osteoarthritis in the include  DIP greater than PIP joints IMPRESSION: No acute fracture. Electronically Signed   By: Macy Mis M.D.   On: 01/05/2022 18:07   ? ? ? ?Allergies  ?Allergen Reactions  ? Sulfa Antibiotics Anaphylaxis  ? Sulfa Drugs Cross Reactors Anaphylaxis  ? Simvastatin Other (See Comments)  ?  Muscle pain  ? Penicillins Other (See Comments)  ?  Childhood allergy- reaction not recalled ?Tolerates cephalosporins  ? Lisinopril Cough  ? ? ?Past Medical History:  ?Diagnosis Date  ? Arthritis   ? hands   ? Hepatitis    ? hx of Hep A as a child   ? Hyperlipemia   ? Hypertension   ? Hypothyroidism   ? Osteopenia 10/31/2013  ? DEXA 01/2013- T+ -2.3 with elvated FRAX of 20.9%, rec consideration of [place]- patient declines now 04/2014/cla  ? Thyroid disease   ? ?Social History  ? ?Socioeconomic History  ? Marital status: Married  ?  Spouse name: Not on file  ? Number of children: 3  ? Years of education: 28  ? Highest education level: Not on file  ?Occupational History  ? 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  ?  Employer: [employer]  ?Tobacco Use  ? Smoking status: Never  ? Smokeless tobacco: Never  ?Vaping Use  ? Vaping Use: Never used  ?Substance and Sexual Activity  ? Alcohol use: Yes  ?  Comment: wine and beer occas   ? Drug use: No  ? Sexual activity: Yes  ?Other Topics Concern  ? Not on file  ?Social History Narrative  ? Physically active with classes at gym   ? ?Social Determinants of Health  ? ?Financial Resource Strain: Low Risk   ? Difficulty of Paying Living Expenses: Not hard at all  ?Food Insecurity: No Food Insecurity  ? Worried About Charity fundraiser in the Last Year: Never true  ? Ran Out of Food in the Last Year: Never true  ?Transportation Needs: No Transportation Needs  ? Lack of Transportation (Medical): No  ? Lack of Transportation (Non-Medical): No  ?Physical Activity: Sufficiently Active  ? Days of Exercise per Week: 5 days  ? Minutes of Exercise per Session: 60 min  ?Stress: No Stress Concern Present  ? Feeling of Stress : Not at all  ?Social Connections: Moderately Integrated  ? Frequency of Communication with Friends and Family: More than three times a week  ? Frequency of Social Gatherings with Friends and Family: More than three times a week  ? Attends Religious Services: More than 4 times per year  ? Active Member of Clubs or Organizations: No  ? Attends Archivist Meetings: Never  ? Marital Status: Married  ? ?Family History  ?Problem Relation Age of Onset  ? Alzheimer's disease Mother   ? Diabetes Mother    ? Heart disease Mother   ?     CHF   ? Hypertension Mother   ? Heart disease Father   ? Breast cancer Sister   ? Hyperlipidemia Brother   ? ?Past Surgical History:  ?Procedure Laterality Date  ? APPENDECTOMY    ? BREAST LUMPECTOMY WITH RADIOACTIVE SEED LOCALIZATION Left 05/20/2021  ? Procedure: LEFT BREAST LUMPECTOMY WITH RADIOACTIVE SEED LOCALIZATION;  Surgeon: Erroll Luna, MD;  Location: [facility];  Service: General;  Laterality: Left;  ? HAND SURGERY  2013  ? left hand due to trauma  ? I & D EXTREMITY  01/19/2012  ? Procedure: IRRIGATION AND DEBRIDEMENT EXTREMITY;  Surgeon: Sheral Apley  Burney Gauze, MD;  Location: [facility];  Service: Orthopedics;  Laterality: Left;  with common digital artery repair  ? INGUINAL HERNIA REPAIR Right 02/04/2020  ? Procedure: OPEN RIGHT INGUINAL HERNIA REPAIR;  Surgeon: Alphonsa Overall, MD;  [facility];  Service: General;  Laterality: Right;  ? LAPAROTOMY    ? for endometriosis in 20s   ? MOUTH SURGERY  09/11/2021  ? irritation fibromas removed by Dr Benson Norway per pt  ? TONSILLECTOMY AND ADENOIDECTOMY    ? VAGINAL HYSTERECTOMY    ? vaginal, partial, DUB   ? ? ? ?  ?Vanessa Kick, MD ?01/06/22 0940 ? ?

## 2022-03-24 DIAGNOSIS — Z1231 Encounter for screening mammogram for malignant neoplasm of breast: Secondary | ICD-10-CM | POA: Diagnosis not present

## 2022-03-24 LAB — HM MAMMOGRAPHY

## 2022-03-25 ENCOUNTER — Encounter: Payer: Self-pay | Admitting: Family Medicine

## 2022-04-17 ENCOUNTER — Other Ambulatory Visit: Payer: Self-pay | Admitting: Family Medicine

## 2022-05-17 ENCOUNTER — Encounter: Payer: Self-pay | Admitting: *Deleted

## 2022-05-31 DIAGNOSIS — Z09 Encounter for follow-up examination after completed treatment for conditions other than malignant neoplasm: Secondary | ICD-10-CM | POA: Diagnosis not present

## 2022-05-31 DIAGNOSIS — Z803 Family history of malignant neoplasm of breast: Secondary | ICD-10-CM | POA: Diagnosis not present

## 2022-06-17 ENCOUNTER — Encounter: Payer: Self-pay | Admitting: Family Medicine

## 2022-06-17 ENCOUNTER — Ambulatory Visit (INDEPENDENT_AMBULATORY_CARE_PROVIDER_SITE_OTHER): Payer: Medicare Other | Admitting: Family Medicine

## 2022-06-17 VITALS — BP 120/76 | HR 65 | Temp 97.8°F | Ht 65.0 in | Wt 132.4 lb

## 2022-06-17 DIAGNOSIS — I10 Essential (primary) hypertension: Secondary | ICD-10-CM | POA: Diagnosis not present

## 2022-06-17 DIAGNOSIS — M810 Age-related osteoporosis without current pathological fracture: Secondary | ICD-10-CM

## 2022-06-17 DIAGNOSIS — Z1212 Encounter for screening for malignant neoplasm of rectum: Secondary | ICD-10-CM

## 2022-06-17 DIAGNOSIS — E782 Mixed hyperlipidemia: Secondary | ICD-10-CM | POA: Diagnosis not present

## 2022-06-17 DIAGNOSIS — Z1211 Encounter for screening for malignant neoplasm of colon: Secondary | ICD-10-CM | POA: Diagnosis not present

## 2022-06-17 DIAGNOSIS — E039 Hypothyroidism, unspecified: Secondary | ICD-10-CM

## 2022-06-17 LAB — CBC WITH DIFFERENTIAL/PLATELET
Basophils Absolute: 0 10*3/uL (ref 0.0–0.1)
Basophils Relative: 0.6 % (ref 0.0–3.0)
Eosinophils Absolute: 0.1 10*3/uL (ref 0.0–0.7)
Eosinophils Relative: 1.9 % (ref 0.0–5.0)
HCT: 39.2 % (ref 36.0–46.0)
Hemoglobin: 13.1 g/dL (ref 12.0–15.0)
Lymphocytes Relative: 24.2 % (ref 12.0–46.0)
Lymphs Abs: 1.4 10*3/uL (ref 0.7–4.0)
MCHC: 33.3 g/dL (ref 30.0–36.0)
MCV: 86.3 fl (ref 78.0–100.0)
Monocytes Absolute: 0.4 10*3/uL (ref 0.1–1.0)
Monocytes Relative: 6.4 % (ref 3.0–12.0)
Neutro Abs: 3.9 10*3/uL (ref 1.4–7.7)
Neutrophils Relative %: 66.9 % (ref 43.0–77.0)
Platelets: 203 10*3/uL (ref 150.0–400.0)
RBC: 4.54 Mil/uL (ref 3.87–5.11)
RDW: 13.8 % (ref 11.5–15.5)
WBC: 5.8 10*3/uL (ref 4.0–10.5)

## 2022-06-17 LAB — COMPREHENSIVE METABOLIC PANEL
ALT: 11 U/L (ref 0–35)
AST: 17 U/L (ref 0–37)
Albumin: 4.4 g/dL (ref 3.5–5.2)
Alkaline Phosphatase: 52 U/L (ref 39–117)
BUN: 20 mg/dL (ref 6–23)
CO2: 31 mEq/L (ref 19–32)
Calcium: 10.1 mg/dL (ref 8.4–10.5)
Chloride: 103 mEq/L (ref 96–112)
Creatinine, Ser: 0.79 mg/dL (ref 0.40–1.20)
GFR: 73.08 mL/min (ref >60.00)
Glucose, Bld: 99 mg/dL (ref 70–99)
Potassium: 4 mEq/L (ref 3.5–5.1)
Sodium: 141 mEq/L (ref 135–145)
Total Bilirubin: 0.7 mg/dL (ref 0.2–1.2)
Total Protein: 7.1 g/dL (ref 6.0–8.3)

## 2022-06-17 LAB — LIPID PANEL
Cholesterol: 142 mg/dL (ref 0–200)
HDL: 67.3 mg/dL (ref >39.00)
LDL Cholesterol: 58 mg/dL (ref 0–99)
NonHDL: 75.15
Total CHOL/HDL Ratio: 2
Triglycerides: 88 mg/dL (ref 0.0–149.0)
VLDL: 17.6 mg/dL (ref 0.0–40.0)

## 2022-06-17 LAB — TSH: TSH: 1.02 u[IU]/mL (ref 0.35–5.50)

## 2022-06-17 MED ORDER — LOSARTAN POTASSIUM-HCTZ 100-25 MG PO TABS
1.0000 | ORAL_TABLET | Freq: Every day | ORAL | 3 refills | Status: DC
Start: 2022-06-17 — End: 2023-06-20

## 2022-06-17 MED ORDER — ROSUVASTATIN CALCIUM 5 MG PO TABS: 5.0000 mg | ORAL_TABLET | Freq: Every day | ORAL | 3 refills | Status: DC

## 2022-06-17 NOTE — Patient Instructions (Signed)
Please return in 12 months for your annual complete physical; please come fasting.   I will release your lab results to you on your MyChart account with further instructions. You may see the results before I do, but when I review them I will send you a message with my report or have my assistant call you if things need to be discussed. Please reply to my message with any questions. Thank you!   If you have any questions or concerns, please don't hesitate to send me a message via MyChart or call the office at [phone]. Thank you for visiting with [place] today! It's our pleasure caring for you.   I recommend the [facility] test for your colon cancer screening that is due. I have ordered this test for you. The [facility] will soon contact you to verify your insurance, address etc. They will then send you the kit; follow the instructions in the kit and return the kit to [facility]. They will run the test and send the results to me. I will then give you the results. If this test is negative, we recommend repeating a colon cancer screening test in 3 years. If it is positive, I will refer you to a Gastroenterologist so you can get set up for the recommended colonoscopy.  Thank you!

## 2022-06-17 NOTE — Progress Notes (Signed)
Subjective  Chief Complaint  Patient presents with   Annual Exam    Pt here for Annual exam and is not currently fasting     HPI: Kristi Jones is a 76 y.o. female who presents to [place] [facility] today for a Female Wellness Visit. She also has the concerns and/or needs as listed above in the chief complaint. These will be addressed in addition to the Health Maintenance Visit.   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 No concerns. Screens: mammo current; due for CRC screen; had nl colonoscopy 2013 with Dr. Collene Mares. Strongly prefers to avoid further colonoscopy. Avg risk. Declines vaccines. Exercises regularly. Eats healthy. No mood or cognitive concerns Chronic disease f/u and/or acute problem visit: (deemed necessary to be done in addition to the wellness visit): HTN is controlled on hyzaar. Feeling well. Taking medications w/o adverse effects. No symptoms of CHF, angina; no palpitations, sob, cp or lower extremity edema. Compliant with meds.  HDL on crestor 5 and tolerating: (was intolerant to simvastatin). Had cranberry juice this am. Weight is stable. Low fat diet and active.  Osteoporosis on drug holiday from fosamax after 5 years of treatment. Due dexa next year.  Hypothyodism: no concerns regarding this. Compliant and feels great.   Assessment  1. Essential hypertension   2. Mixed hyperlipidemia   3. Acquired hypothyroidism   4. Age-related osteoporosis without current pathological fracture   5. Screening for colorectal cancer      Plan  Female Wellness Visit: Age appropriate Health Maintenance and Prevention measures were discussed with patient. Included topics are cancer screening recommendations, ways to keep healthy (see AVS) including dietary and exercise recommendations, regular eye and dental care, use of seat belts, and avoidance of  moderate alcohol use and tobacco use. Discussed options for crc screen: elect cologuard BMI: discussed patient's BMI and encouraged positive lifestyle modifications to help get to or maintain a target BMI. HM needs and immunizations were addressed and ordered. See below for orders. See HM and immunization section for updates.declines Routine labs and screening tests ordered including cmp, cbc and lipids where appropriate. Discussed recommendations regarding Vit D and calcium supplementation (see AVS)  Chronic disease management visit and/or acute problem visit: HTN and HLD are both well controlled. Hyzaar 100/25 and crestor 5 refilled. Recheck renal, lytes and lipids today with lfts.  Osteoporosis: drug holiday x 1 year more and then recheck dexa; continue exercise and D/ca Recheck tsh on levothyroxine 34mg daily. Clinically euthyroid.   Follow up: No follow-ups on file.  Orders Placed This Encounter  Procedures   CBC with Differential/Platelet   Comprehensive metabolic panel   Lipid panel   TSH   Cologuard   Meds ordered this encounter  Medications   losartan-hydrochlorothiazide (HYZAAR) 100-25 MG tablet    Sig: Take 1 tablet by mouth daily.    Dispense:  90 tablet    Refill:  3   rosuvastatin (CRESTOR) 5 MG tablet    Sig: Take 1 tablet (5 mg total) by mouth at bedtime.    Dispense:  90 tablet    Refill:  3      Body mass index is 22.03 kg/m. Wt Readings from Last 3 Encounters:  06/17/22 132 lb 6.4 oz (60.1 kg)  01/05/22 130 lb 6.4 oz (59.1 kg)  12/16/21 130 lb 6.4 oz (59.1 kg)  Patient Active Problem List   Diagnosis Date Noted   Mixed hyperlipidemia 05/10/2014    Priority: High    Failed simvastatin due to myalgias    Essential hypertension 10/31/2013    Priority: High   Acquired hypothyroidism 10/31/2013    Priority: High   Breast fibroadenoma, left 11/08/2014    Priority: Medium     Left subareolar.  Status post biopsy    Age-related osteoporosis  without current pathological fracture 10/31/2013    Priority: Medium     DEXA 01/2013 - T - -2.3 with elevated FRAX of 20.9%; rec consideration of fosamax - pt declines now 04/2014/cla DEXA 11/2014 T= - 2.5 with elevated FRZS of 23% and elevated hip frac 4% - Fosamax started/cla DEXA 01/2017: T=-2.2, lowest; 4% improved in femur, 11% improvement in spine on fosamax/ 12/2017 DEXA 01/2019: R femur neck T-Score: -2.20 lowest on fosamax. Stable on meds. Continue 02/2019/cla DEXA 01/2021: [facility]: osteopenia w/ decrease in a few areas. Has been on fosamax for 5 years, rec drug holiday and recheck in 2 years. cla    Sebaceous cyst, upper back 06/15/2012    Priority: Low   Intraductal papilloma of breast, left 06/15/2021    Priority: Medium     Lumpectomy 04/2021, no malignancy    Atypical ductal hyperplasia of left breast 06/15/2021    Priority: Medium     Lumpectomy 04/2021, no malignancy    Hemangioma of spleen 07/13/2021    mulitple benign by MRI 06/2021. No furthre eval needed.     Pneumococcal vaccine refused 01/24/2019   Health Maintenance  Topic Date Due   Fecal DNA (Cologuard)  Never done   COVID-19 Vaccine (5 - Moderna risk series) 07/03/2022 (Originally 09/08/2021)   Medicare Annual Wellness (AWV)  01/04/2023   DEXA SCAN  03/05/2023   MAMMOGRAM  03/25/2023   INFLUENZA VACCINE  Completed   Hepatitis C Screening  Completed   Zoster Vaccines- Shingrix  Completed   HPV VACCINES  Aged Out   Pneumonia Vaccine 31+ Years old  Discontinued   COLONOSCOPY (Pts 45-58yr Insurance coverage will need to be confirmed)  Discontinued   TETANUS/TDAP  Discontinued   Immunization History  Administered Date(s) Administered   Fluad Quad(high Dose 65+) 06/12/2021, 05/31/2022   Influenza, High Dose Seasonal PF 06/04/2015   Influenza,inj,Quad PF,6+ Mos 05/07/2020, 06/11/2020   Moderna SARS-COV2 Booster Vaccination 03/10/2021   Moderna Sars-Covid-2 Vaccination 09/05/2019, 10/04/2019, 07/29/2020,  07/14/2021   Tdap 11/09/2010, 01/05/2022   Zoster Recombinat (Shingrix) 06/17/2021, 09/14/2021   We updated and reviewed the patient's past history in detail and it is documented below. Allergies: Patient is allergic to sulfa antibiotics, sulfa drugs cross reactors, simvastatin, penicillins, and lisinopril. Past Medical History Patient  has a past medical history of Arthritis, Hepatitis, Hyperlipemia, Hypertension, Hypothyroidism, Osteopenia (10/31/2013), and Thyroid disease. Past Surgical History Patient  has a past surgical history that includes I & D extremity (01/19/2012); Appendectomy; Hand surgery (2013); Vaginal hysterectomy; Tonsillectomy and adenoidectomy; laparotomy; Inguinal hernia repair (Right, 02/04/2020); Breast lumpectomy with radioactive seed localization (Left, 05/20/2021); and Mouth surgery (09/11/2021). Family History: Patient family history includes Alzheimer's disease in her mother; Breast cancer in her sister; Diabetes in her mother; Heart disease in her father and mother; Hyperlipidemia in her brother; Hypertension in her mother. Social History:  Patient  reports that she has never smoked. She has never used smokeless tobacco. She reports current alcohol use. She reports that she does not use drugs.  Review of Systems: Constitutional: negative for fever or  malaise Ophthalmic: negative for photophobia, double vision or loss of vision Cardiovascular: negative for chest pain, dyspnea on exertion, or new LE swelling Respiratory: negative for SOB or persistent cough Gastrointestinal: negative for abdominal pain, change in bowel habits or melena Genitourinary: negative for dysuria or gross hematuria, no abnormal uterine bleeding or disharge Musculoskeletal: negative for new gait disturbance or muscular weakness Integumentary: negative for new or persistent rashes, no breast lumps Neurological: negative for TIA or stroke symptoms Psychiatric: negative for SI or  delusions Allergic/Immunologic: negative for hives  Patient Care Team    Relationship Specialty Notifications Start End  Leamon Arnt, MD PCP - General Family Medicine  01/10/18   Dermatology, [facility]    09/28/18   Delsa Sale, OD Consulting Physician Optometry  10/05/19     Objective  Vitals: BP 120/76   Pulse 65   Temp 97.8 F (36.6 C)   Ht '5\' 5"'$  (1.651 m)   Wt 132 lb 6.4 oz (60.1 kg)   SpO2 97%   BMI 22.03 kg/m  General:  Well developed, well nourished, no acute distress  Psych:  Alert and orientedx3,normal mood and affect HEENT:  Normocephalic, atraumatic, non-icteric sclera,  supple neck without adenopathy, mass or thyromegaly Cardiovascular:  Normal S1, S2, RRR without gallop, rub or murmur Respiratory:  Good breath sounds bilaterally, CTAB with normal respiratory effort Gastrointestinal: normal bowel sounds, soft, non-tender, no noted masses. No HSM MSK: no deformities, contusions. Joints are without erythema or swelling.  Skin:  Warm, no rashes or suspicious lesions noted Neurologic:    Mental status is normalGross motor and sensory exams are normal. Normal gait. No tremor   Commons side effects, risks, benefits, and alternatives for medications and treatment plan prescribed today were discussed, and the patient expressed understanding of the given instructions. Patient is instructed to call or message via MyChart if he/she has any questions or concerns regarding our treatment plan. No barriers to understanding were identified. We discussed Red Flag symptoms and signs in detail. Patient expressed understanding regarding what to do in case of urgent or emergency type symptoms.  Medication list was reconciled, printed and provided to the patient in AVS. Patient instructions and summary information was reviewed with the patient as documented in the AVS. This note was prepared with assistance of Dragon voice recognition software. Occasional wrong-word or sound-a-like  substitutions may have occurred due to the inherent limitations of voice recognition software

## 2022-06-25 DIAGNOSIS — Z1212 Encounter for screening for malignant neoplasm of rectum: Secondary | ICD-10-CM | POA: Diagnosis not present

## 2022-06-25 DIAGNOSIS — Z1211 Encounter for screening for malignant neoplasm of colon: Secondary | ICD-10-CM | POA: Diagnosis not present

## 2022-07-02 LAB — COLOGUARD: COLOGUARD: NEGATIVE

## 2022-07-05 ENCOUNTER — Encounter: Payer: Self-pay | Admitting: Family Medicine

## 2022-08-06 ENCOUNTER — Other Ambulatory Visit: Payer: Self-pay | Admitting: Family Medicine

## 2022-08-11 ENCOUNTER — Other Ambulatory Visit: Payer: Self-pay | Admitting: Surgery

## 2022-08-11 DIAGNOSIS — Z1239 Encounter for other screening for malignant neoplasm of breast: Secondary | ICD-10-CM

## 2022-08-27 ENCOUNTER — Telehealth: Payer: Self-pay | Admitting: Family Medicine

## 2022-08-27 NOTE — Telephone Encounter (Signed)
Patient states; -Just changed insurance plan and wanted to update pharmacy and insurance  - New pharmacy is [facility] [address] [place] [address] [phone]

## 2022-09-02 ENCOUNTER — Encounter: Payer: Self-pay | Admitting: Surgery

## 2022-09-03 ENCOUNTER — Other Ambulatory Visit: Payer: Medicare Other

## 2022-11-05 ENCOUNTER — Ambulatory Visit
Admission: RE | Admit: 2022-11-05 | Discharge: 2022-11-05 | Disposition: A | Discharge status: Home / Self Care | Payer: Medicare HMO | Source: Ambulatory Visit | Attending: Surgery | Admitting: Surgery

## 2022-11-05 DIAGNOSIS — R922 Inconclusive mammogram: Secondary | ICD-10-CM | POA: Diagnosis not present

## 2022-11-05 DIAGNOSIS — Z803 Family history of malignant neoplasm of breast: Secondary | ICD-10-CM | POA: Diagnosis not present

## 2022-11-05 DIAGNOSIS — Z1239 Encounter for other screening for malignant neoplasm of breast: Secondary | ICD-10-CM

## 2022-11-05 MED ORDER — GADOPICLENOL 0.5 MMOL/ML IV SOLN
6.0000 mL | Freq: Once | INTRAVENOUS | Status: AC | PRN
  Administered 2022-11-05: 6 mL via INTRAVENOUS

## 2022-11-17 DIAGNOSIS — Z0100 Encounter for examination of eyes and vision without abnormal findings: Secondary | ICD-10-CM | POA: Diagnosis not present

## 2022-11-17 DIAGNOSIS — H524 Presbyopia: Secondary | ICD-10-CM | POA: Diagnosis not present

## 2022-11-30 ENCOUNTER — Telehealth: Payer: Self-pay | Admitting: Family Medicine

## 2022-11-30 NOTE — Telephone Encounter (Signed)
Copied from CRM [id number]. Topic: Medicare AWV >> Nov 30, 2022  9:11 AM Gwenith Spitz wrote: Reason for CRM: Called patient to REschedule Medicare Annual Wellness Visit (AWV). Left message for patient to call back and REschedule Medicare Annual Wellness Visit (AWV).  Last date of AWV: 12/04/2021  Please schedule an appointment at any time with Inetta Fermo, [facility]. Please schedule AWVS with Inetta Fermo, [facility].  If any questions, please contact me at [phone].  Thank you ,  Gabriel Cirri [facility] AWV TEAM Direct Dial [phone]

## 2022-11-30 NOTE — Telephone Encounter (Signed)
Contacted Delon [place] to schedule their annual wellness visit. Appointment made for 12/07/2022.  Gabriel Cirri [facility] AWV TEAM Direct Dial [phone]

## 2022-12-07 ENCOUNTER — Ambulatory Visit (INDEPENDENT_AMBULATORY_CARE_PROVIDER_SITE_OTHER): Payer: Medicare HMO

## 2022-12-07 VITALS — Wt 132.0 lb

## 2022-12-07 DIAGNOSIS — Z0000 Encounter for general adult medical examination without abnormal findings: Secondary | ICD-10-CM | POA: Diagnosis not present

## 2022-12-07 NOTE — Progress Notes (Signed)
I connected with  Kristi Jones on 12/07/22 by a audio enabled telemedicine application and verified that I am speaking with the correct person using two identifiers.  Patient Location: Home  Provider Location: Office/Clinic  I discussed the limitations of evaluation and management by telemedicine. The patient expressed understanding and agreed to proceed.   Subjective:   Kristi Jones is a 77 y.o. female who presents for Medicare Annual (Subsequent) preventive examination.  Review of Systems     Cardiac Risk Factors include: advanced age (>54men, >41 women);dyslipidemia;hypertension     Objective:    Today's Vitals   12/07/22 1554  Weight: 132 lb (59.9 kg)   Body mass index is 21.97 kg/m.     12/07/2022    3:58 PM 12/04/2021    2:21 PM 05/20/2021    7:21 AM 05/14/2021   12:46 PM 11/03/2020   11:20 AM 02/01/2020    8:07 AM 01/29/2020    9:56 PM  Advanced Directives  Does Patient Have a Medical Advance Directive? Yes Yes Yes Yes Yes No;Yes No  Type of Estate agent of [place];Living will Healthcare Power of [employer] of [place];Living will Healthcare Power of [place];Living will    Does patient want to make changes to medical advance directive? No - Patient declined  No - Patient declined No - Patient declined     Copy of Healthcare Power of Attorney in Chart? Yes - validated most recent copy scanned in chart (See row information) Yes - validated most recent copy scanned in chart (See row information)  Yes - validated most recent copy scanned in chart (See row information) Yes - validated most recent copy scanned in chart (See row information)    Would patient like information on creating a medical advance directive?   No - Patient declined    No - Guardian declined    Current Medications (verified) Outpatient Encounter Medications as of 12/07/2022  Medication Sig   folic acid (FOLVITE) 1 MG tablet TAKE 1 TABLET ONCE DAILY.    levothyroxine (SYNTHROID) 88 MCG tablet Take 1 tablet (88 mcg total) by mouth daily before breakfast.   losartan-hydrochlorothiazide (HYZAAR) 100-25 MG tablet Take 1 tablet by mouth daily.   rosuvastatin (CRESTOR) 5 MG tablet Take 1 tablet (5 mg total) by mouth at bedtime.   No facility-administered encounter medications on file as of 12/07/2022.    Allergies (verified) Sulfa antibiotics, Sulfa drugs cross reactors, Simvastatin, Penicillins, and Lisinopril   History: Past Medical History:  Diagnosis Date   Arthritis    hands    Hepatitis    hx of Hep A as a child    Hyperlipemia    Hypertension    Hypothyroidism    Osteopenia 10/31/2013   DEXA 01/2013- T+ -2.3 with elvated FRAX of 20.9%, rec consideration of Fosamaz- patient declines now 04/2014/cla   Thyroid disease    Past Surgical History:  Procedure Laterality Date   APPENDECTOMY     BREAST LUMPECTOMY WITH RADIOACTIVE SEED LOCALIZATION Left 05/20/2021   Procedure: LEFT BREAST LUMPECTOMY WITH RADIOACTIVE SEED LOCALIZATION;  Surgeon: Harriette Bouillon, MD;  Location: [facility];  Service: General;  Laterality: Left;   HAND SURGERY  2013   left hand due to trauma   I & D EXTREMITY  01/19/2012   Procedure: IRRIGATION AND DEBRIDEMENT EXTREMITY;  Surgeon: Marlowe Shores, MD;  Location: [facility] OR;  Service: Orthopedics;  Laterality: Left;  with common digital artery repair   INGUINAL HERNIA  REPAIR Right 02/04/2020   Procedure: OPEN RIGHT INGUINAL HERNIA REPAIR;  Surgeon: Ovidio Kin, MD;  Location: [facility];  Service: General;  Laterality: Right;   LAPAROTOMY     for endometriosis in 20s    MOUTH SURGERY  09/11/2021   irritation fibromas removed by Dr Chales Salmon per pt   TONSILLECTOMY AND ADENOIDECTOMY     VAGINAL HYSTERECTOMY     vaginal, partial, DUB    Family History  Problem Relation Age of Onset   Alzheimer's disease Mother    Diabetes Mother    Heart disease Mother        CHF    Hypertension Mother     Heart disease Father    Breast cancer Sister    Hyperlipidemia Brother    Social History   Socioeconomic History   Marital status: Married    Spouse name: Not on file   Number of children: 3   Years of education: 16   Highest education level: Not on file  Occupational History   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    Employer: [employer]  Tobacco Use   Smoking status: Never   Smokeless tobacco: Never  Vaping Use   Vaping Use: Never used  Substance and Sexual Activity   Alcohol use: Yes    Comment: wine and beer occas    Drug use: No   Sexual activity: Yes  Other Topics Concern   Not on file  Social History Narrative   Physically active with classes at gym    Social Determinants of Health   Financial Resource Strain: Low Risk  (12/02/2022)   Overall Financial Resource Strain (CARDIA)    Difficulty of Paying Living Expenses: Not hard at all  Food Insecurity: No Food Insecurity (12/02/2022)   Hunger Vital Sign    Worried About Running Out of Food in the Last Year: Never true    Ran Out of Food in the Last Year: Never true  Transportation Needs: No Transportation Needs (12/02/2022)   PRAPARE - Administrator, Civil Service (Medical): No    Lack of Transportation (Non-Medical): No  Physical Activity: Sufficiently Active (12/02/2022)   Exercise Vital Sign    Days of Exercise per Week: 6 days    Minutes of Exercise per Session: 60 min  Stress: No Stress Concern Present (12/02/2022)   [employer] of Occupational Health - Occupational Stress Questionnaire    Feeling of Stress : Not at all  Social Connections: Moderately Integrated (12/02/2022)   Social Connection and Isolation Panel [NHANES]    Frequency of Communication with Friends and Family: More than three times a week    Frequency of Social Gatherings with Friends and Family: Once a week    Attends Religious Services: More than 4 times per year    Active Member of [employer] or Organizations: No    Attends Museum/gallery exhibitions officer: Never    Marital Status: Married    Tobacco Counseling Counseling given: Not Answered   Clinical Intake:  Pre-visit preparation completed: Yes  Pain : No/denies pain     BMI - recorded: 21.97 Nutritional Status: BMI of 19-24  Normal Nutritional Risks: None Diabetes: No  How often do you need to have someone help you when you read instructions, pamphlets, or other written materials from your doctor or pharmacy?: (P) 1 - Never  Diabetic?no  Interpreter Needed?: No  Information entered by :: Lanier Ensign, LPN   Activities of Daily Living    12/02/2022  5:49 PM  In your present state of health, do you have any difficulty performing the following activities:  Hearing? 0  Vision? 0  Difficulty concentrating or making decisions? 0  Walking or climbing stairs? 0  Dressing or bathing? 0  Doing errands, shopping? 0  Preparing Food and eating ? N  Using the Toilet? N  In the past six months, have you accidently leaked urine? N  Do you have problems with loss of bowel control? N  Managing your Medications? N  Managing your Finances? N  Housekeeping or managing your Housekeeping? N    Patient Care Team: Willow Ora, MD as PCP - General [facility] Medicine) Dermatology, Hope Budds, Helen Hashimoto, OD as Consulting Physician (Optometry)  Indicate any recent Medical Services you may have received from other than [facility] providers in the past year (date may be approximate).     Assessment:   This is a routine wellness examination for Owen.  Hearing/Vision screen Hearing Screening - Comments:: Pt denies any hearing issue  Vision Screening - Comments:: Pt [facility] up with [facility] for annual eye exams   Dietary issues and exercise activities discussed: Current Exercise Habits: Home exercise routine, Time (Minutes): > 60, Frequency (Times/Week): 6, Weekly Exercise (Minutes/Week): 0   Goals Addressed             This Visit's  Progress    Patient Stated       Maintain health        Depression Screen    12/07/2022    3:56 PM 06/17/2022    9:53 AM 12/04/2021    2:06 PM 11/03/2020   11:18 AM 05/09/2020   10:02 AM 10/05/2019   10:20 AM 09/28/2018    2:52 PM  PHQ 2/9 Scores  PHQ - 2 Score 0 0 0 0 0 0 0    Fall Risk    12/02/2022    5:49 PM 06/17/2022    9:53 AM 12/04/2021    2:10 PM 11/03/2020   11:21 AM 05/09/2020   10:01 AM  Fall Risk   Falls in the past year? 0 0 0 0 0  Number falls in past yr: 0 0 0 0 0  Injury with Fall? 0 0 0 0   Risk for fall due to : Impaired vision No Fall Risks Impaired vision Impaired vision   Follow up Falls prevention discussed Falls evaluation completed Falls prevention discussed Falls prevention discussed     FALL RISK PREVENTION PERTAINING TO THE HOME:  Any stairs in or around the home? No  If so, are there any without handrails? No  Home free of loose throw rugs in walkways, pet beds, electrical cords, etc? Yes  Adequate lighting in your home to reduce risk of falls? Yes   ASSISTIVE DEVICES UTILIZED TO PREVENT FALLS:  Life alert? No  Use of a cane, walker or w/c? No  Grab bars in the bathroom? Yes  Shower chair or bench in shower? No  Elevated toilet seat or a handicapped toilet? No   TIMED UP AND GO:  Was the test performed? No .   Cognitive Function:    09/28/2018    2:52 PM  MMSE - Mini Mental State Exam  Orientation to time 5  Orientation to Place 5  Registration 3  Attention/ Calculation 5  Recall 3  Language- name 2 objects 2  Language- repeat 1  Language- follow 3 step command 3  Language- read & follow direction 1  Write a sentence  1  Copy design 1  Total score 30        12/07/2022    3:59 PM 12/04/2021    2:11 PM 11/03/2020   11:22 AM  6CIT Screen  What Year? 0 points 0 points 0 points  What month? 0 points 0 points 0 points  What time? 0 points 0 points   Count back from 20 0 points 0 points 0 points  Months in reverse 0 points 0  points 0 points  Repeat phrase 0 points 0 points 0 points  Total Score 0 points 0 points     Immunizations Immunization History  Administered Date(s) Administered   Fluad Quad(high Dose 65+) 06/12/2021, 05/31/2022   Influenza, High Dose Seasonal PF 06/04/2015   Influenza,inj,Quad PF,6+ Mos 05/07/2020, 06/11/2020   Moderna SARS-COV2 Booster Vaccination 03/10/2021   Moderna Sars-Covid-2 Vaccination 09/05/2019, 10/04/2019, 07/29/2020, 07/14/2021   Tdap 11/09/2010, 01/05/2022   Zoster Recombinat (Shingrix) 06/17/2021, 09/14/2021    TDAP status: Up to date  Flu Vaccine status: Up to date  Pneumococcal vaccine status: Declined,  Education has been provided regarding the importance of this vaccine but patient still declined. Advised may receive this vaccine at local pharmacy or Health Dept. Aware to provide a copy of the vaccination record if obtained from local pharmacy or Health Dept. Verbalized acceptance and understanding.   Covid-19 vaccine status: Completed vaccines  Qualifies for Shingles Vaccine? Yes   Zostavax completed Yes   Shingrix Completed?: Yes  Screening Tests Health Maintenance  Topic Date Due   COVID-19 Vaccine (5 - 2023-24 season) 04/23/2022   DEXA SCAN  03/05/2023   INFLUENZA VACCINE  03/24/2023   MAMMOGRAM  11/05/2023   Medicare Annual Wellness (AWV)  12/07/2023   DTaP/Tdap/Td (3 - Td or Tdap) 01/06/2032   Hepatitis C Screening  Completed   Zoster Vaccines- Shingrix  Completed   HPV VACCINES  Aged Out   Pneumonia Vaccine 48+ Years old  Discontinued   COLONOSCOPY (Pts 45-83yrs Insurance coverage will need to be confirmed)  Discontinued   Fecal DNA (Cologuard)  Discontinued    Health Maintenance  Health Maintenance Due  Topic Date Due   COVID-19 Vaccine (5 - 2023-24 season) 04/23/2022    Colorectal cancer screening: No longer required.   Mammogram status: Completed 11/05/22. Repeat every year  Bone Density status: Completed 03/04/21. Results reflect:  Bone density results: OSTEOPENIA. Repeat every 2 years.   Additional Screening:  Hepatitis C Screening:  Completed 02/19/16  Vision Screening: Recommended annual ophthalmology exams for early detection of glaucoma and other disorders of the eye. Is the patient up to date with their annual eye exam?  Yes  Who is the provider or what is the name of the office in which the patient attends annual eye exams? [facility]  If pt is not established with a provider, would they like to be referred to a provider to establish care? No .   Dental Screening: Recommended annual dental exams for proper oral hygiene  Community Resource Referral / Chronic Care Management: CRR required this visit?  No   CCM required this visit?  No      Plan:     I have personally reviewed and noted the following in the patient's chart:   Medical and social history Use of alcohol, tobacco or illicit drugs  Current medications and supplements including opioid prescriptions. Patient is not currently taking opioid prescriptions. Functional ability and status Nutritional status Physical activity Advanced directives List of other physicians Hospitalizations, surgeries,  and ER visits in previous 12 months Vitals Screenings to include cognitive, depression, and falls Referrals and appointments  In addition, I have reviewed and discussed with patient certain preventive protocols, quality metrics, and best practice recommendations. A written personalized care plan for preventive services as well as general preventive health recommendations were provided to patient.     Marzella Schlein, LPN   01/25/5408   Nurse Notes: none

## 2022-12-07 NOTE — Patient Instructions (Signed)
Kristi Jones , Thank you for taking time to come for your Medicare Wellness Visit. I appreciate your ongoing commitment to your health goals. Please review the following plan we discussed and let me know if I can assist you in the future.   These are the goals we discussed:  Goals      Patient Stated     None at this time     Patient Stated     None at this time      Patient Stated     Maintain health      Weight (lb) < 135 lb (61.2 kg)     Lose weight by decreasing carb intake.         This is a list of the screening recommended for you and due dates:  Health Maintenance  Topic Date Due   COVID-19 Vaccine (5 - 2023-24 season) 04/23/2022   DEXA scan (bone density measurement)  03/05/2023   Flu Shot  03/24/2023   Mammogram  11/05/2023   Medicare Annual Wellness Visit  12/07/2023   DTaP/Tdap/Td vaccine (3 - Td or Tdap) 01/06/2032   Hepatitis C Screening: USPSTF Recommendation to screen - Ages 18-79 yo.  Completed   Zoster (Shingles) Vaccine  Completed   HPV Vaccine  Aged Out   Pneumonia Vaccine  Discontinued   Colon Cancer Screening  Discontinued   Cologuard (Stool DNA test)  Discontinued    Advanced directives: copies in chart   Conditions/risks identified: maintain health   Next appointment: Follow up in one year for your annual wellness visit    Preventive Care 65 Years and Older, Female Preventive care refers to lifestyle choices and visits with your health care provider that can promote health and wellness. What does preventive care include? A yearly physical exam. This is also called an annual well check. Dental exams once or twice a year. Routine eye exams. Ask your health care provider how often you should have your eyes checked. Personal lifestyle choices, including: Daily care of your teeth and gums. Regular physical activity. Eating a healthy diet. Avoiding tobacco and drug use. Limiting alcohol use. Practicing safe sex. Taking low-dose aspirin every  day. Taking vitamin and mineral supplements as recommended by your health care provider. What happens during an annual well check? The services and screenings done by your health care provider during your annual well check will depend on your age, overall health, lifestyle risk factors, and family history of disease. Counseling  Your health care provider may ask you questions about your: Alcohol use. Tobacco use. Drug use. Emotional well-being. Home and relationship well-being. Sexual activity. Eating habits. History of falls. Memory and ability to understand (cognition). Work and work Astronomer. Reproductive health. Screening  You may have the following tests or measurements: Height, weight, and BMI. Blood pressure. Lipid and cholesterol levels. These may be checked every 5 years, or more frequently if you are over 30 years old. Skin check. Lung cancer screening. You may have this screening every year starting at age 34 if you have a 30-pack-year history of smoking and currently smoke or have quit within the past 15 years. Fecal occult blood test (FOBT) of the stool. You may have this test every year starting at age 14. Flexible sigmoidoscopy or colonoscopy. You may have a sigmoidoscopy every 5 years or a colonoscopy every 10 years starting at age 61. Hepatitis C blood test. Hepatitis B blood test. Sexually transmitted disease (STD) testing. Diabetes screening. This is done by checking your  blood sugar (glucose) after you have not eaten for a while (fasting). You may have this done every 1-3 years. Bone density scan. This is done to screen for osteoporosis. You may have this done starting at age 45. Mammogram. This may be done every 1-2 years. Talk to your health care provider about how often you should have regular mammograms. Talk with your health care provider about your test results, treatment options, and if necessary, the need for more tests. Vaccines  Your health care  provider may recommend certain vaccines, such as: Influenza vaccine. This is recommended every year. Tetanus, diphtheria, and acellular pertussis (Tdap, Td) vaccine. You may need a Td booster every 10 years. Zoster vaccine. You may need this after age 71. Pneumococcal 13-valent conjugate (PCV13) vaccine. One dose is recommended after age 88. Pneumococcal polysaccharide (PPSV23) vaccine. One dose is recommended after age 55. Talk to your health care provider about which screenings and vaccines you need and how often you need them. This information is not intended to replace advice given to you by your health care provider. Make sure you discuss any questions you have with your health care provider. Document Released: 09/05/2015 Document Revised: 04/28/2016 Document Reviewed: 06/10/2015 Elsevier Interactive Patient Education  2017 [employer].  Fall Prevention in the Home Falls can cause injuries. They can happen to people of all ages. There are many things you can do to make your home safe and to help prevent falls. What can I do on the outside of my home? Regularly fix the edges of walkways and driveways and fix any cracks. Remove anything that might make you trip as you walk through a door, such as a raised step or threshold. Trim any bushes or trees on the path to your home. Use bright outdoor lighting. Clear any walking paths of anything that might make someone trip, such as rocks or tools. Regularly check to see if handrails are loose or broken. Make sure that both sides of any steps have handrails. Any raised decks and porches should have guardrails on the edges. Have any leaves, snow, or ice cleared regularly. Use sand or salt on walking paths during winter. Clean up any spills in your garage right away. This includes oil or grease spills. What can I do in the bathroom? Use night lights. Install grab bars by the toilet and in the tub and shower. Do not use towel bars as grab  bars. Use non-skid mats or decals in the tub or shower. If you need to sit down in the shower, use a plastic, non-slip stool. Keep the floor dry. Clean up any water that spills on the floor as soon as it happens. Remove soap buildup in the tub or shower regularly. Attach bath mats securely with double-sided non-slip rug tape. Do not have throw rugs and other things on the floor that can make you trip. What can I do in the bedroom? Use night lights. Make sure that you have a light by your bed that is easy to reach. Do not use any sheets or blankets that are too big for your bed. They should not hang down onto the floor. Have a firm chair that has side arms. You can use this for support while you get dressed. Do not have throw rugs and other things on the floor that can make you trip. What can I do in the kitchen? Clean up any spills right away. Avoid walking on wet floors. Keep items that you use a lot in  easy-to-reach places. If you need to reach something above you, use a strong step stool that has a grab bar. Keep electrical cords out of the way. Do not use floor polish or wax that makes floors slippery. If you must use wax, use non-skid floor wax. Do not have throw rugs and other things on the floor that can make you trip. What can I do with my stairs? Do not leave any items on the stairs. Make sure that there are handrails on both sides of the stairs and use them. Fix handrails that are broken or loose. Make sure that handrails are as long as the stairways. Check any carpeting to make sure that it is firmly attached to the stairs. Fix any carpet that is loose or worn. Avoid having throw rugs at the top or bottom of the stairs. If you do have throw rugs, attach them to the floor with carpet tape. Make sure that you have a light switch at the top of the stairs and the bottom of the stairs. If you do not have them, ask someone to add them for you. What else can I do to help prevent  falls? Wear shoes that: Do not have high heels. Have rubber bottoms. Are comfortable and fit you well. Are closed at the toe. Do not wear sandals. If you use a stepladder: Make sure that it is fully opened. Do not climb a closed stepladder. Make sure that both sides of the stepladder are locked into place. Ask someone to hold it for you, if possible. Clearly mark and make sure that you can see: Any grab bars or handrails. First and last steps. Where the edge of each step is. Use tools that help you move around (mobility aids) if they are needed. These include: Canes. Walkers. Scooters. Crutches. Turn on the lights when you go into a dark area. Replace any light bulbs as soon as they burn out. Set up your furniture so you have a clear path. Avoid moving your furniture around. If any of your floors are uneven, fix them. If there are any pets around you, be aware of where they are. Review your medicines with your doctor. Some medicines can make you feel dizzy. This can increase your chance of falling. Ask your doctor what other things that you can do to help prevent falls. This information is not intended to replace advice given to you by your health care provider. Make sure you discuss any questions you have with your health care provider. Document Released: 06/05/2009 Document Revised: 01/15/2016 Document Reviewed: 09/13/2014 Elsevier Interactive Patient Education  2017 [employer].

## 2023-01-13 IMAGING — MR MR ABDOMEN WO/W CM
11 of 17 series · 30 of 48 positions shown · IV contrast (12 ML MULTIHANCE)
Comparison: CT on 01/29/2020

CLINICAL DATA: Indeterminate splenic lesions on recent CT.

EXAM:
MRI ABDOMEN WITHOUT AND WITH CONTRAST
TECHNIQUE: Multiplanar multisequence MR imaging of the abdomen was performed
both before and after the administration of intravenous contrast.
CONTRAST:  12mL MULTIHANCE GADOBENATE DIMEGLUMINE 529 MG/ML IV SOLN

[Series 3: cor haste · coronal · 5.0mm · 0.68mm/px · 2 of 32 slices shown]
[im 1/32]
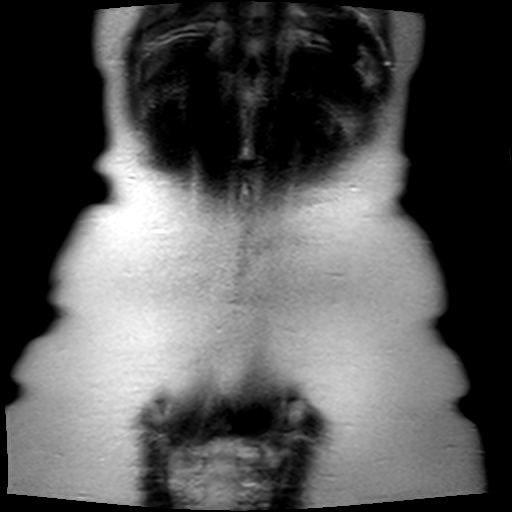
[im 32/32]
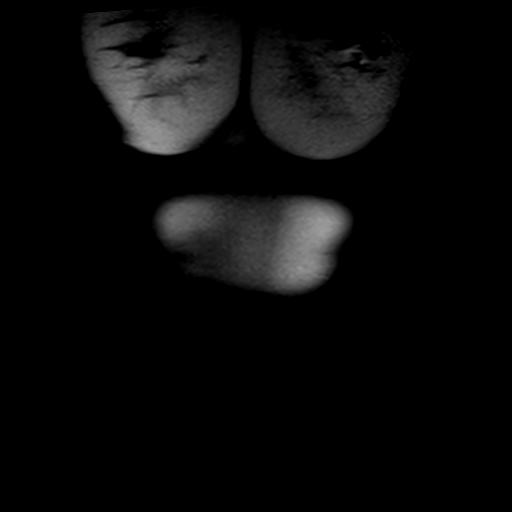

[Series 4: axial haste · axial · 6.0mm · 0.68mm/px · z∈[-136,+76]mm · 2 of 33 slices shown]
[im 1/33]
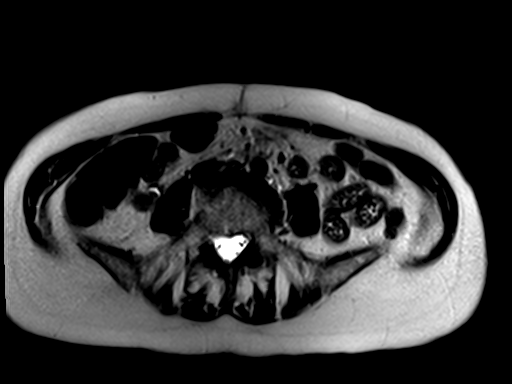
[im 33/33]
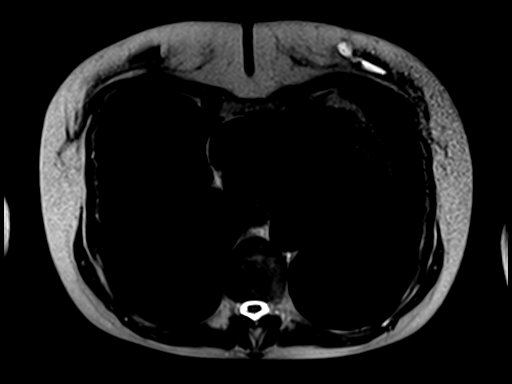

[Series 5: T2 fat-sat · axial · 6.0mm · 1.09mm/px · z∈[-115,+108]mm · 2 of 32 slices shown]
[im 1/32]
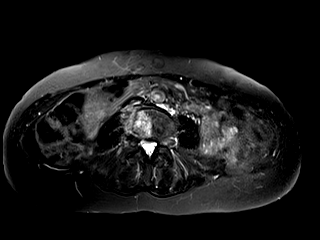
[im 32/32]
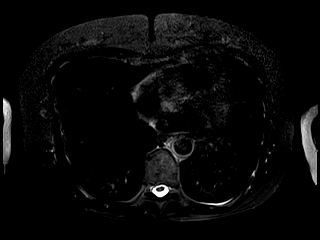

[Series 6: ep2d_diff_b50_500_800_p2_trig · axial · 6.0mm · 1.82mm/px · z∈[-113,+110]mm · 5 of 96 slices shown]
[im 1/96]
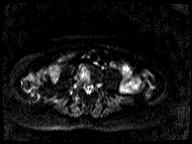
[im 24/96]
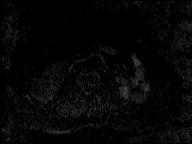
[im 48/96]
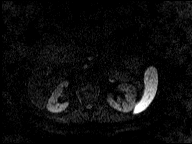
[im 72/96]
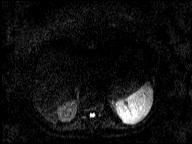
[im 96/96]
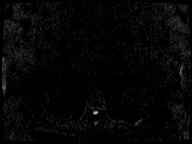

[Series 7: ep2d_diff_b50_500_800_p2_trig_adc · axial · 6.0mm · 1.82mm/px · z∈[-113,+110]mm · 2 of 32 slices shown]
[im 1/32]
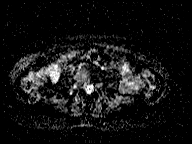
[im 32/32]
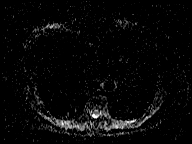

[Series 8: T1 · axial · 6.0mm · 0.68mm/px · z∈[-136,+76]mm · 3 of 66 slices shown]
[im 1/66]
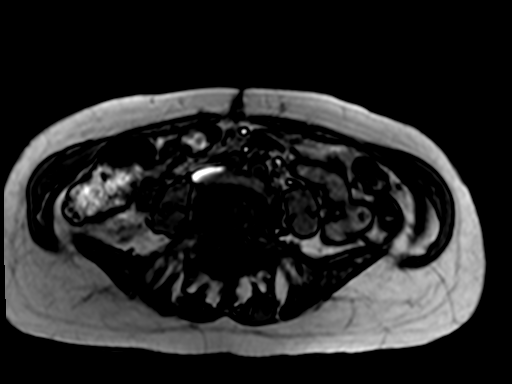
[im 33/66]
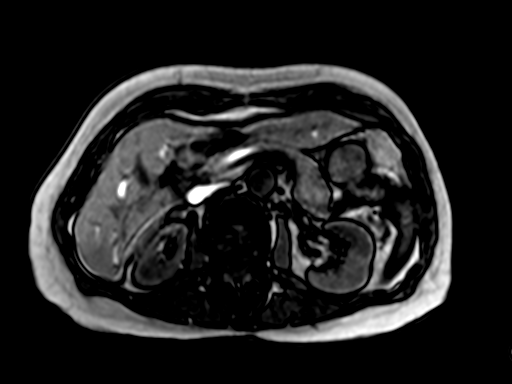
[im 66/66]
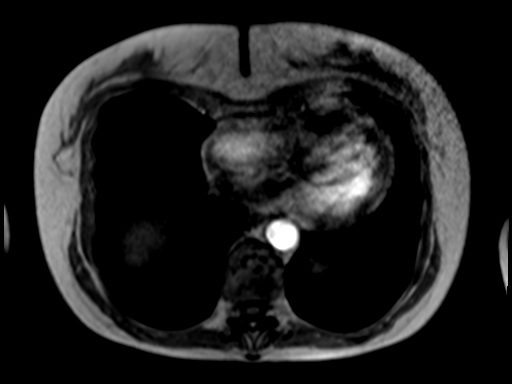

[Series 9: bSSFP · axial · 4.0mm · 0.68mm/px · z∈[-150,+90]mm · 2 of 61 slices shown]
[im 1/61]
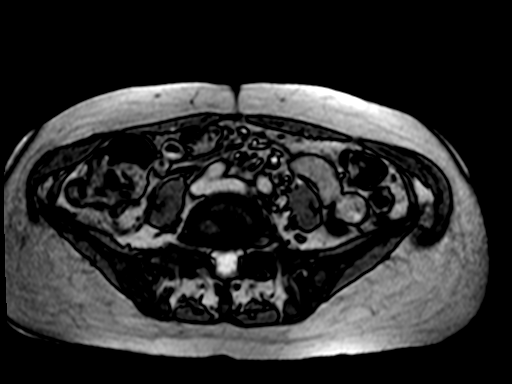
[im 61/61]
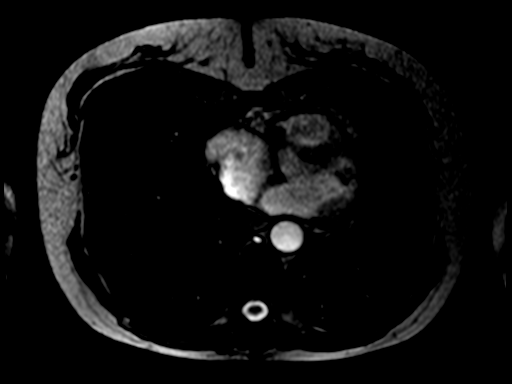

[Series 10: T1 dynamic · axial · non-contrast · 2.5mm · 0.74mm/px · z∈[-139,+79]mm · 3 of 88 slices shown]
[im 1/88]
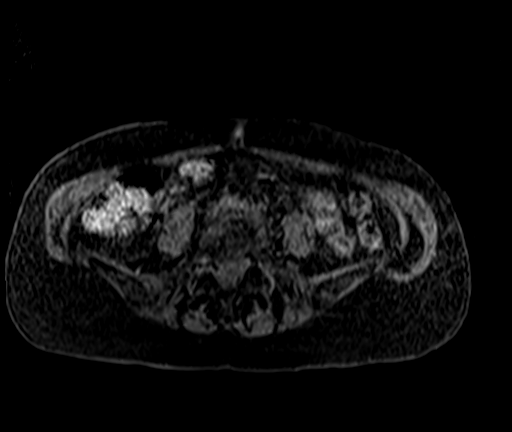
[im 44/88]
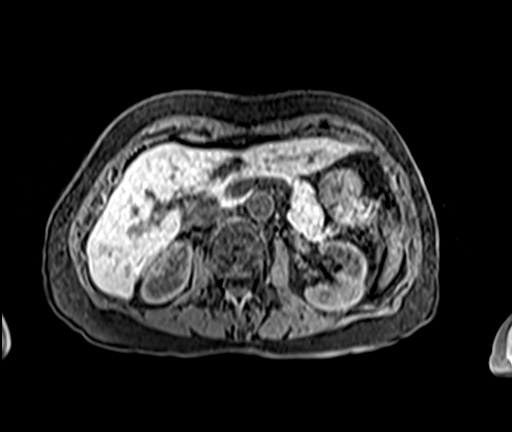
[im 88/88]
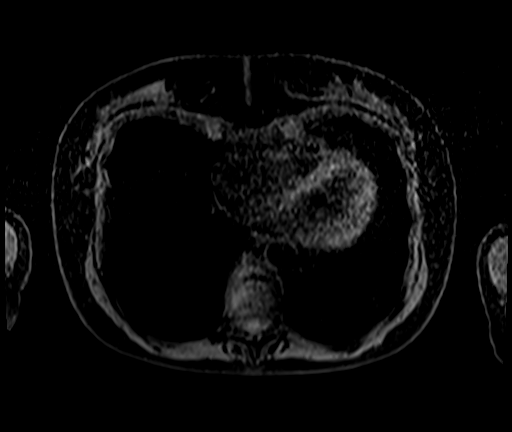

[Series 11: T1 dynamic post-contrast · axial · 2.5mm · 0.74mm/px · z∈[-139,+79]mm · 3 of 88 slices shown (1 of 3)]
[im 1/88]
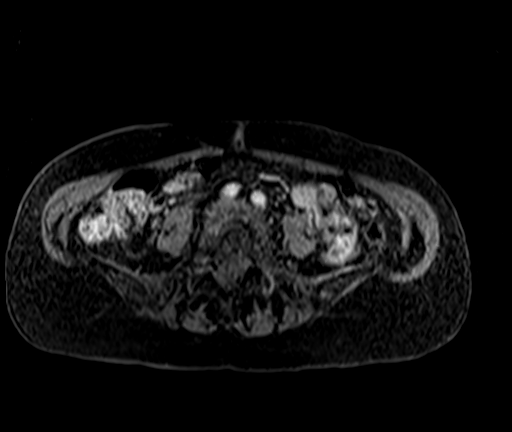
[im 44/88]
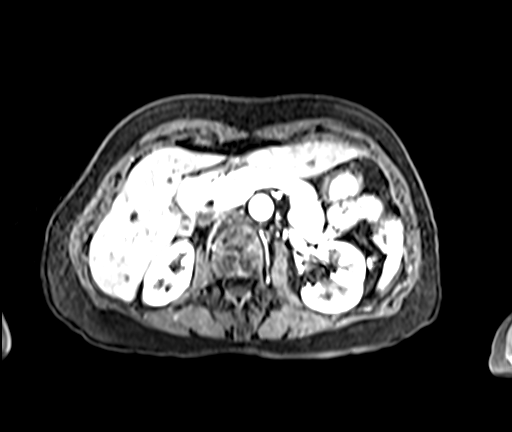
[im 88/88]
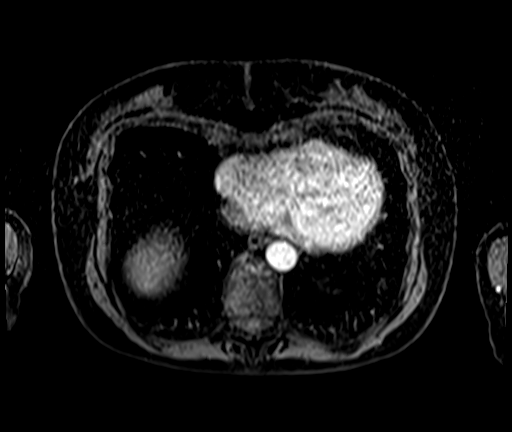

[Series 12: T1 dynamic post-contrast · axial · 2.5mm · 0.74mm/px · z∈[-139,+79]mm · 3 of 88 slices shown (2 of 3)]
[im 1/88]
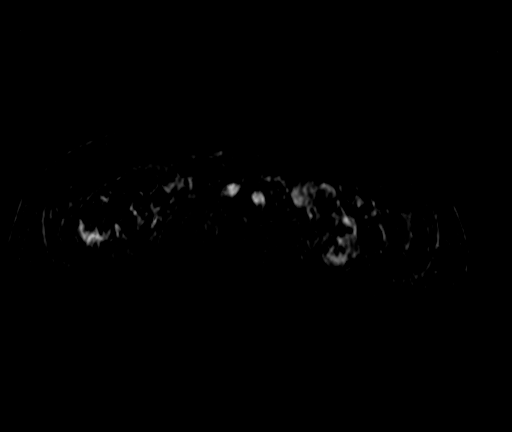
[im 44/88]
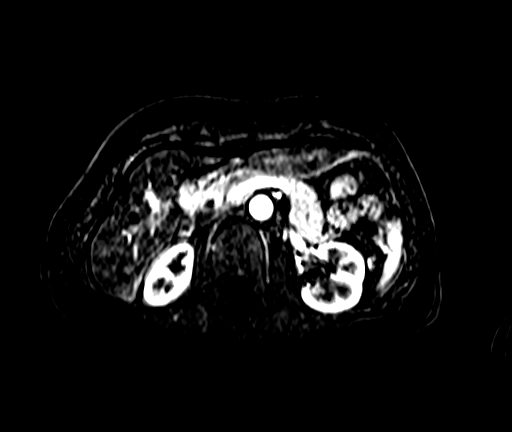
[im 88/88]
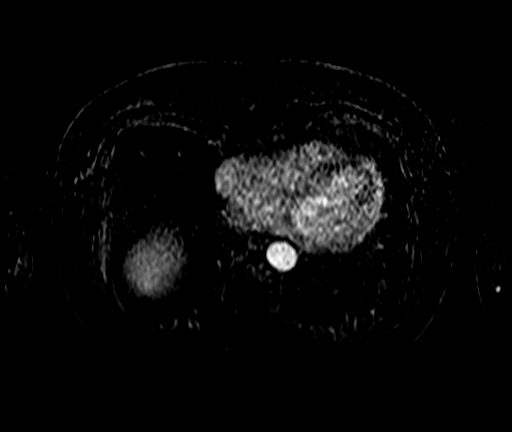

[Series 13: T1 dynamic post-contrast · axial · 2.5mm · 0.74mm/px · z∈[-139,+79]mm · 3 of 88 slices shown (3 of 3)]
[im 1/88]
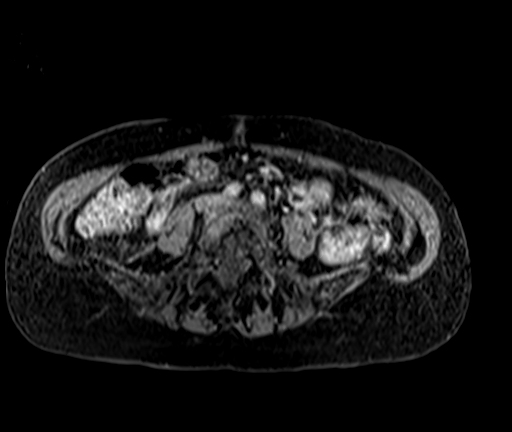
[im 44/88]
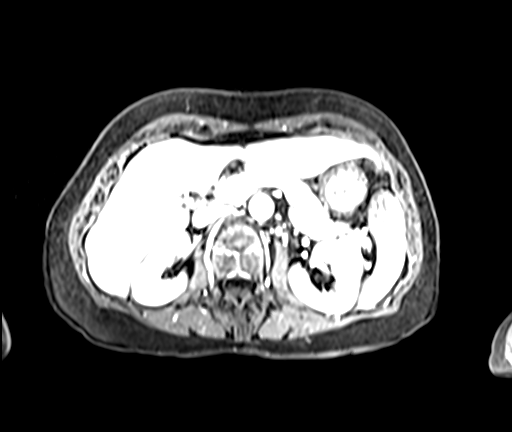
[im 88/88]
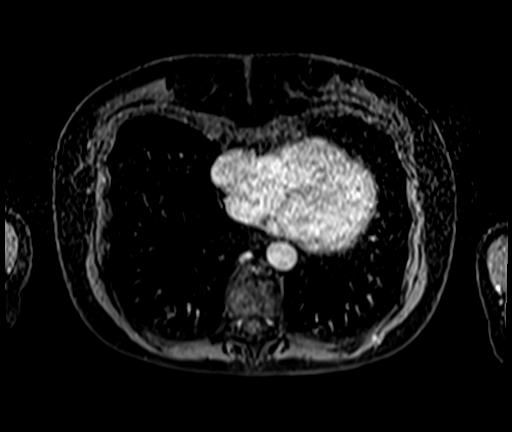

[30 of 48 positions shown; findings below may reference images not displayed]

FINDINGS: Lower chest: No acute findings.

Hepatobiliary: Several small approximately 1 cm benign hemangiomas
are seen in both the right and left hepatic lobes. Gallbladder is
unremarkable. No evidence of biliary ductal dilatation.

Pancreas:  No mass or inflammatory changes.

Spleen: No evidence of splenomegaly. Numerous small T2 hyperintense
splenic masses are seen, which show progressive nodular contrast
enhancement on dynamic imaging. These are stable compared to
previous CT, and consistent with benign hemangiomas.

Adrenals/Urinary Tract: No masses identified. No evidence of
hydronephrosis.

Stomach/Bowel: Visualized portion unremarkable.

Vascular/Lymphatic: No pathologically enlarged lymph nodes
identified. No acute vascular findings.

Other:  None.

Musculoskeletal:  No suspicious bone lesions identified.
IMPRESSION: Numerous small benign splenic hemangiomas, stable compared to
previous CT.

Several tiny benign hepatic hemangiomas.

No acute findings.  No evidence of malignancy.

## 2023-03-30 DIAGNOSIS — Z1231 Encounter for screening mammogram for malignant neoplasm of breast: Secondary | ICD-10-CM | POA: Diagnosis not present

## 2023-03-30 LAB — HM MAMMOGRAPHY

## 2023-04-04 ENCOUNTER — Encounter: Payer: Self-pay | Admitting: Family Medicine

## 2023-04-28 ENCOUNTER — Telehealth: Payer: Self-pay | Admitting: Family Medicine

## 2023-04-28 NOTE — Telephone Encounter (Signed)
Patient has a new Preferred Pharmacy -listed below and in Chart  Prescription Request  04/28/2023  LOV: 06/17/2022  What is the name of the medication or equipment?  levothyroxine (SYNTHROID) 88 MCG tablet  Have you contacted your pharmacy to request a refill? Yes   Which pharmacy would you like this sent to?  [id number] [place], [place] - [address] [place] [address] Phone: [phone] Fax: [phone]    Patient notified that their request is being sent to the clinical staff for review and that they should receive a response within 2 business days.   Please advise at Mobile [phone] (mobile)

## 2023-04-29 ENCOUNTER — Other Ambulatory Visit: Payer: Self-pay

## 2023-04-29 MED ORDER — LEVOTHYROXINE SODIUM 88 MCG PO TABS: 88.0000 ug | ORAL_TABLET | Freq: Every day | ORAL | 0 refills | Status: DC

## 2023-05-22 ENCOUNTER — Encounter: Payer: Self-pay | Admitting: Family Medicine

## 2023-05-24 ENCOUNTER — Ambulatory Visit (INDEPENDENT_AMBULATORY_CARE_PROVIDER_SITE_OTHER): Payer: Medicare HMO | Admitting: Podiatry

## 2023-05-24 ENCOUNTER — Encounter: Payer: Self-pay | Admitting: Podiatry

## 2023-05-24 DIAGNOSIS — M2041 Other hammer toe(s) (acquired), right foot: Secondary | ICD-10-CM | POA: Diagnosis not present

## 2023-05-24 DIAGNOSIS — K5730 Diverticulosis of large intestine without perforation or abscess without bleeding: Secondary | ICD-10-CM | POA: Insufficient documentation

## 2023-05-24 DIAGNOSIS — L02611 Cutaneous abscess of right foot: Secondary | ICD-10-CM | POA: Diagnosis not present

## 2023-05-24 DIAGNOSIS — D2371 Other benign neoplasm of skin of right lower limb, including hip: Secondary | ICD-10-CM

## 2023-05-24 MED ORDER — CLINDAMYCIN HCL 150 MG PO CAPS: 150.0000 mg | ORAL_CAPSULE | Freq: Three times a day (TID) | ORAL | 1 refills | Status: DC

## 2023-05-24 NOTE — Progress Notes (Signed)
Subjective:  Patient ID: Kristi Jones, female    DOB: 11-03-1945,  MRN: [id number] HPI Chief Complaint  Patient presents with   Toe Pain    4th toe right - corn lateral side x several months, tried corn remover and made it sore, she has a presentation in 2 days and has to wear dress shoes   New Patient (Initial Visit)    77 y.o. female presents with the above complaint.   ROS: Denies fever chills nausea vomit muscle aches pains calf pain back pain chest pain shortness of breath.  Past Medical History:  Diagnosis Date   Arthritis    hands    Hepatitis    hx of Hep A as a child    Hyperlipemia    Hypertension    Hypothyroidism    Osteopenia 10/31/2013   DEXA 01/2013- T+ -2.3 with elvated FRAX of 20.9%, rec consideration of Fosamaz- patient declines now 04/2014/cla   Thyroid disease    Past Surgical History:  Procedure Laterality Date   APPENDECTOMY     BREAST LUMPECTOMY WITH RADIOACTIVE SEED LOCALIZATION Left 05/20/2021   Procedure: LEFT BREAST LUMPECTOMY WITH RADIOACTIVE SEED LOCALIZATION;  Surgeon: Harriette Bouillon, MD;  Location: [facility];  Service: General;  Laterality: Left;   HAND SURGERY  2013   left hand due to trauma   I & D EXTREMITY  01/19/2012   Procedure: IRRIGATION AND DEBRIDEMENT EXTREMITY;  Surgeon: Marlowe Shores, MD;  Location: [facility];  Service: Orthopedics;  Laterality: Left;  with common digital artery repair   INGUINAL HERNIA REPAIR Right 02/04/2020   Procedure: OPEN RIGHT INGUINAL HERNIA REPAIR;  Surgeon: Ovidio Kin, MD;  Location: [facility];  Service: General;  Laterality: Right;   LAPAROTOMY     for endometriosis in 20s    MOUTH SURGERY  09/11/2021   irritation fibromas removed by Dr Chales Salmon per pt   TONSILLECTOMY AND ADENOIDECTOMY     VAGINAL HYSTERECTOMY     vaginal, partial, DUB     Current Outpatient Medications:    clindamycin (CLEOCIN) 150 MG capsule, Take 1 capsule (150 mg total) by mouth 3 (three) times daily.,  Disp: 30 capsule, Rfl: 1   folic acid (FOLVITE) 1 MG tablet, TAKE 1 TABLET ONCE DAILY., Disp: 90 tablet, Rfl: 3   levothyroxine (SYNTHROID) 88 MCG tablet, Take 1 tablet (88 mcg total) by mouth daily before breakfast., Disp: 60 tablet, Rfl: 0   losartan-hydrochlorothiazide (HYZAAR) 100-25 MG tablet, Take 1 tablet by mouth daily., Disp: 90 tablet, Rfl: 3   rosuvastatin (CRESTOR) 5 MG tablet, Take 1 tablet (5 mg total) by mouth at bedtime., Disp: 90 tablet, Rfl: 3  Allergies  Allergen Reactions   Sulfa Antibiotics Anaphylaxis   Sulfa Drugs Cross Reactors Anaphylaxis   Simvastatin Other (See Comments)    Muscle pain   Penicillins Other (See Comments)    Childhood allergy- reaction not recalled Tolerates cephalosporins   Lisinopril Cough   Review of Systems Objective:  There were no vitals filed for this visit.  General: Well developed, nourished, in no acute distress, alert and oriented x3   Dermatological: Skin is warm, dry and supple bilateral. Nails x 10 are well maintained; remaining integument appears unremarkable at this time. There are no open sores, no preulcerative lesions, no rash or signs of infection present.  White macerated tissue overlying the dorsal lateral aspect of the DIPJ fourth digit right foot with erythema in the toe and swelling of the toe itself.  Once  debrided does demonstrate a track to the capsule.  Vascular: Dorsalis Pedis artery and Posterior Tibial artery pedal pulses are 2/4 bilateral with immedate capillary fill time. Pedal hair growth present. No varicosities and no lower extremity edema present bilateral.   Neruologic: Grossly intact via light touch bilateral. Vibratory intact via tuning fork bilateral. Protective threshold with Semmes Wienstein monofilament intact to all pedal sites bilateral. Patellar and Achilles deep tendon reflexes 2+ bilateral. No Babinski or clonus noted bilateral.   Musculoskeletal: No gross boney pedal deformities bilateral. No  pain, crepitus, or limitation noted with foot and ankle range of motion bilateral. Muscular strength 5/5 in all groups tested bilateral.  Gait: Unassisted, Nonantalgic.    Radiographs:  None taken yet  Assessment & Plan:   Assessment: Cellulitis fourth digit right foot with osteoarthritis.    Plan: Discussed etiology pathology conservative surgical therapies at this point go ahead and get her on clindamycin she will take that twice daily for me I will go ahead and follow-up with her in 2 weeks I want her to start Epsom salts and warm water soaks covering the day with small amount of Neosporin and a small Band-Aid.  Make sure she wear shoes that do not occlude the toe.     Briggs Edelen T. Duran, [place]

## 2023-06-06 DIAGNOSIS — Z803 Family history of malignant neoplasm of breast: Secondary | ICD-10-CM | POA: Diagnosis not present

## 2023-06-06 DIAGNOSIS — Z1239 Encounter for other screening for malignant neoplasm of breast: Secondary | ICD-10-CM | POA: Diagnosis not present

## 2023-06-07 ENCOUNTER — Ambulatory Visit (INDEPENDENT_AMBULATORY_CARE_PROVIDER_SITE_OTHER): Payer: Medicare HMO

## 2023-06-07 ENCOUNTER — Ambulatory Visit: Payer: Medicare HMO | Admitting: Podiatry

## 2023-06-07 ENCOUNTER — Encounter: Payer: Self-pay | Admitting: Podiatry

## 2023-06-07 DIAGNOSIS — L02611 Cutaneous abscess of right foot: Secondary | ICD-10-CM

## 2023-06-07 NOTE — Progress Notes (Signed)
She presents today for follow-up of her hammertoe fourth digit right foot.  She states that she is completed her antibiotics and her toe feels so much better she is very happy with the outcome.  Objective: Vital signs are stable she is alert and oriented x 3.  There is no erythema edema cellulitis drainage or odor.  Radiographs taken today demonstrate osteoarthritic changes but I do not see any breakdown of the bone no periostitis nothing that indicates osteomyelitis.  Assessment: Mallet toe deformity hammertoe deformity resolving abscess fourth digit right foot.  Plan: She has completed her antibiotics recommended open toed shoes padding to help prevent irritation of the toe follow-up with her on an as-needed basis.

## 2023-06-17 ENCOUNTER — Other Ambulatory Visit: Payer: Self-pay | Admitting: Medical Genetics

## 2023-06-17 DIAGNOSIS — Z006 Encounter for examination for normal comparison and control in clinical research program: Secondary | ICD-10-CM

## 2023-06-20 ENCOUNTER — Ambulatory Visit (INDEPENDENT_AMBULATORY_CARE_PROVIDER_SITE_OTHER): Payer: Medicare HMO | Admitting: Family Medicine

## 2023-06-20 ENCOUNTER — Encounter: Payer: Self-pay | Admitting: Family Medicine

## 2023-06-20 VITALS — BP 138/70 | HR 66 | Temp 98.1°F | Ht 65.0 in | Wt 133.6 lb

## 2023-06-20 DIAGNOSIS — Z0000 Encounter for general adult medical examination without abnormal findings: Secondary | ICD-10-CM | POA: Diagnosis not present

## 2023-06-20 DIAGNOSIS — I10 Essential (primary) hypertension: Secondary | ICD-10-CM

## 2023-06-20 DIAGNOSIS — E039 Hypothyroidism, unspecified: Secondary | ICD-10-CM

## 2023-06-20 DIAGNOSIS — M810 Age-related osteoporosis without current pathological fracture: Secondary | ICD-10-CM

## 2023-06-20 DIAGNOSIS — E782 Mixed hyperlipidemia: Secondary | ICD-10-CM | POA: Diagnosis not present

## 2023-06-20 DIAGNOSIS — Z0001 Encounter for general adult medical examination with abnormal findings: Secondary | ICD-10-CM

## 2023-06-20 LAB — COMPREHENSIVE METABOLIC PANEL
ALT: 12 U/L (ref 0–35)
AST: 19 U/L (ref 0–37)
Albumin: 4.3 g/dL (ref 3.5–5.2)
Alkaline Phosphatase: 63 U/L (ref 39–117)
BUN: 17 mg/dL (ref 6–23)
CO2: 29 meq/L (ref 19–32)
Calcium: 9.9 mg/dL (ref 8.4–10.5)
Chloride: 101 meq/L (ref 96–112)
Creatinine, Ser: 0.85 mg/dL (ref 0.40–1.20)
GFR: 66.46 mL/min (ref >60.00)
Glucose, Bld: 98 mg/dL (ref 70–99)
Potassium: 3.6 meq/L (ref 3.5–5.1)
Sodium: 140 meq/L (ref 135–145)
Total Bilirubin: 0.6 mg/dL (ref 0.2–1.2)
Total Protein: 7.2 g/dL (ref 6.0–8.3)

## 2023-06-20 LAB — LIPID PANEL
Cholesterol: 142 mg/dL (ref 0–200)
HDL: 58.3 mg/dL (ref >39.00)
LDL Cholesterol: 62 mg/dL (ref 0–99)
NonHDL: 83.66
Total CHOL/HDL Ratio: 2
Triglycerides: 107 mg/dL (ref 0.0–149.0)
VLDL: 21.4 mg/dL (ref 0.0–40.0)

## 2023-06-20 LAB — VITAMIN D 25 HYDROXY (VIT D DEFICIENCY, FRACTURES): VITD: 18.95 ng/mL — ABNORMAL LOW (ref 30.00–100.00)

## 2023-06-20 LAB — CBC WITH DIFFERENTIAL/PLATELET
Basophils Absolute: 0 10*3/uL (ref 0.0–0.1)
Basophils Relative: 0.4 % (ref 0.0–3.0)
Eosinophils Absolute: 0.4 10*3/uL (ref 0.0–0.7)
Eosinophils Relative: 7 % — ABNORMAL HIGH (ref 0.0–5.0)
HCT: 39.8 % (ref 36.0–46.0)
Hemoglobin: 13 g/dL (ref 12.0–15.0)
Lymphocytes Relative: 16.7 % (ref 12.0–46.0)
Lymphs Abs: 1.1 10*3/uL (ref 0.7–4.0)
MCHC: 32.7 g/dL (ref 30.0–36.0)
MCV: 86.8 fL (ref 78.0–100.0)
Monocytes Absolute: 0.4 10*3/uL (ref 0.1–1.0)
Monocytes Relative: 6.3 % (ref 3.0–12.0)
Neutro Abs: 4.4 10*3/uL (ref 1.4–7.7)
Neutrophils Relative %: 69.6 % (ref 43.0–77.0)
Platelets: 190 10*3/uL (ref 150.0–400.0)
RBC: 4.58 Mil/uL (ref 3.87–5.11)
RDW: 14 % (ref 11.5–15.5)
WBC: 6.4 10*3/uL (ref 4.0–10.5)

## 2023-06-20 LAB — TSH: TSH: 0.68 u[IU]/mL (ref 0.35–5.50)

## 2023-06-20 MED ORDER — ROSUVASTATIN CALCIUM 5 MG PO TABS: 5.0000 mg | ORAL_TABLET | Freq: Every day | ORAL | 3 refills | Status: DC

## 2023-06-20 MED ORDER — LOSARTAN POTASSIUM-HCTZ 100-25 MG PO TABS: 1.0000 | ORAL_TABLET | Freq: Every day | ORAL | 3 refills | Status: DC

## 2023-06-20 NOTE — Patient Instructions (Signed)
Please return in 12 mo for cpe  I will release your lab results to you on your MyChart account with further instructions. You may see the results before I do, but when I review them I will send you a message with my report or have my assistant call you if things need to be discussed. Please reply to my message with any questions. Thank you!   If you have any questions or concerns, please don't hesitate to send me a message via MyChart or call the office at [phone]. Thank you for visiting with Kristi Jones today! It's our pleasure caring for you.

## 2023-06-20 NOTE — Progress Notes (Signed)
Subjective  Chief Complaint  Patient presents with   Annual Exam    Pt here for Annual exam and is currently fasting. DEXA has not been scheduled   Hypertension    HPI: Kristi Jones is a 77 y.o. female who presents to [facility] [place] today for a Female Wellness Visit. She also has the concerns and/or needs as listed above in the chief complaint. These will be addressed in addition to the Health Maintenance Visit.   Wellness Visit: annual visit with health maintenance review and exam  HM: neg cologuard last year. Mammo current. Needs f/u mri annually as well. Healthy lifestyle and no concerns. Chronic disease f/u and/or acute problem visit: (deemed necessary to be done in addition to the wellness visit): HTN is controlled HLD on statin and tolerating Thyroid: has been stable w/o sxs and compliant with meds Osteoporosis: defers f/u dexa until next year. Year 2-3 of drug holiday.   Assessment  1. Encounter for well adult exam with abnormal findings   2. Essential hypertension   3. Mixed hyperlipidemia   4. Acquired hypothyroidism   5. Age-related osteoporosis without current pathological fracture      Plan  Female Wellness Visit: Age appropriate Health Maintenance and Prevention measures were discussed with patient. Included topics are cancer screening recommendations, ways to keep healthy (see AVS) including dietary and exercise recommendations, regular eye and dental care, use of seat belts, and avoidance of moderate alcohol use and tobacco use.  BMI: discussed patient's BMI and encouraged positive lifestyle modifications to help get to or maintain a target BMI. HM needs and immunizations were addressed and ordered. See below for orders. See HM and immunization section for updates. Routine labs and screening tests ordered including cmp, cbc and lipids where appropriate. Discussed recommendations regarding Vit D and calcium supplementation (see  AVS)  Chronic disease management visit and/or acute problem visit: Osteoporosis: repeat dexa next year. Will be year 3 of drug holiday. Cont weight bearing exercise HTN and HLD are controlled. Recheck labs today and refilled crestor 5 and hyzaar 100/25 Continue levothyroxine daily. Recheck tsh Monitor vit D  Follow up: 12 mo for cpe  Orders Placed This Encounter  Procedures   DG Bone Density   VITAMIN D 25 Hydroxy (Vit-D Deficiency, Fractures)   CBC with Differential/Platelet   Comprehensive metabolic panel   Lipid panel   TSH   Meds ordered this encounter  Medications   losartan-hydrochlorothiazide (HYZAAR) 100-25 MG tablet    Sig: Take 1 tablet by mouth daily.    Dispense:  90 tablet    Refill:  3   rosuvastatin (CRESTOR) 5 MG tablet    Sig: Take 1 tablet (5 mg total) by mouth at bedtime.    Dispense:  90 tablet    Refill:  3      Body mass index is 22.23 kg/m. Wt Readings from Last 3 Encounters:  06/20/23 133 lb 9.6 oz (60.6 kg)  12/07/22 132 lb (59.9 kg)  06/17/22 132 lb 6.4 oz (60.1 kg)     Patient Active Problem List   Diagnosis Date Noted Date Diagnosed   Mixed hyperlipidemia 05/10/2014     Priority: High    Failed simvastatin due to myalgias/ tolerates crestor 5    Essential hypertension 10/31/2013     Priority: High   Acquired hypothyroidism 10/31/2013     Priority: High   Breast fibroadenoma, left 11/08/2014     Priority: Medium  Left subareolar.  Status post biopsy    Age-related osteoporosis without current pathological fracture 10/31/2013     Priority: Medium     DEXA 01/2013 - T - -2.3 with elevated FRAX of 20.9%; rec consideration of fosamax - pt declines now 04/2014/cla DEXA 11/2014 T= - 2.5 with elevated FRZS of 23% and elevated hip frac 4% - Fosamax started/cla DEXA 01/2017: T=-2.2, lowest; 4% improved in femur, 11% improvement in spine on fosamax/ 12/2017 DEXA 01/2019: R femur neck T-Score: -2.20 lowest on fosamax. Stable on meds.  Continue 02/2019/cla DEXA 01/2021: [facility]: osteopenia w/ decrease in a few areas. Has been on fosamax for 5 years, rec drug holiday and recheck in 2 years. cla    Sebaceous cyst, upper back 06/15/2012     Priority: Low   Intraductal papilloma of breast, left 06/15/2021     Priority: Medium     Lumpectomy 04/2021, no malignancy    Atypical ductal hyperplasia of left breast 06/15/2021     Priority: Medium     Lumpectomy 04/2021, no malignancy    Diverticular disease of colon 05/24/2023    Hemangioma of spleen 07/13/2021     mulitple benign by MRI 06/2021. No furthre eval needed.     Pneumococcal vaccine refused 01/24/2019    Health Maintenance  Topic Date Due   DEXA SCAN  03/05/2023   COVID-19 Vaccine (6 - 2023-24 season) 08/13/2023   Medicare Annual Wellness (AWV)  12/07/2023   MAMMOGRAM  03/29/2024   DTaP/Tdap/Td (3 - Td or Tdap) 01/06/2032   INFLUENZA VACCINE  Completed   Hepatitis C Screening  Completed   Zoster Vaccines- Shingrix  Completed   HPV VACCINES  Aged Out   Pneumonia Vaccine 81+ Years old  Discontinued   Colonoscopy  Discontinued   Fecal DNA (Cologuard)  Discontinued   Immunization History  Administered Date(s) Administered   Fluad Quad(high Dose 65+) 06/12/2021, 05/31/2022   Fluad Trivalent(High Dose 65+) 06/13/2023   Influenza, High Dose Seasonal PF 06/04/2015   Influenza,inj,Quad PF,6+ Mos 05/07/2020, 06/11/2020   Moderna Covid-19 Vaccine Bivalent Booster 84yrs & up 06/18/2023   Moderna SARS-COV2 Booster Vaccination 03/10/2021   Moderna Sars-Covid-2 Vaccination 09/05/2019, 10/04/2019, 07/29/2020, 07/14/2021   Tdap 11/09/2010, 01/05/2022   Zoster Recombinant(Shingrix) 06/17/2021, 09/14/2021   We updated and reviewed the patient's past history in detail and it is documented below. Allergies: Patient is allergic to sulfa antibiotics, sulfa drugs cross reactors, simvastatin, penicillins, and lisinopril. Past Medical History Patient  has a past medical history  of Arthritis, Hepatitis, Hyperlipemia, Hypertension, Hypothyroidism, Osteopenia (10/31/2013), and Thyroid disease. Past Surgical History Patient  has a past surgical history that includes I & D extremity (01/19/2012); Appendectomy; Hand surgery (2013); Vaginal hysterectomy; Tonsillectomy and adenoidectomy; laparotomy; Inguinal hernia repair (Right, 02/04/2020); Breast lumpectomy with radioactive seed localization (Left, 05/20/2021); Mouth surgery (09/11/2021); Breast surgery (04/2021); and Hernia repair (02/04/2020). Family History: Patient family history includes Alzheimer's disease in her mother; Breast cancer in her sister; Cancer in her sister; Diabetes in her brother and mother; Heart disease in her father and mother; Hyperlipidemia in her brother; Hypertension in her brother and mother; Kidney disease in her mother. Social History:  Patient  reports that she has never smoked. She has never used smokeless tobacco. She reports current alcohol use of about 5.0 standard drinks of alcohol per week. She reports that she does not use drugs.  Review of Systems: Constitutional: negative for fever or malaise Ophthalmic: negative for photophobia, double vision or loss of vision Cardiovascular: negative  for chest pain, dyspnea on exertion, or new LE swelling Respiratory: negative for SOB or persistent cough Gastrointestinal: negative for abdominal pain, change in bowel habits or melena Genitourinary: negative for dysuria or gross hematuria, no abnormal uterine bleeding or disharge Musculoskeletal: negative for new gait disturbance or muscular weakness Integumentary: negative for new or persistent rashes, no breast lumps Neurological: negative for TIA or stroke symptoms Psychiatric: negative for SI or delusions Allergic/Immunologic: negative for hives  Patient Care Team    Relationship Specialty Notifications Start End  Willow Ora, MD PCP - General Family Medicine  01/10/18   Dermatology,  [facility]    09/28/18   Carman Ching, OD Consulting Physician Optometry  10/05/19     Objective  Vitals: BP 138/70   Pulse 66   Temp 98.1 F (36.7 C)   Ht 5\' 5"  (1.651 m)   Wt 133 lb 9.6 oz (60.6 kg)   SpO2 98%   BMI 22.23 kg/m  General:  Well developed, well nourished, no acute distress  Psych:  Alert and orientedx3,normal mood and affect HEENT:  Normocephalic, atraumatic, non-icteric sclera,  supple neck without adenopathy, mass or thyromegaly Cardiovascular:  Normal S1, S2, RRR without gallop, rub or murmur Respiratory:  Good breath sounds bilaterally, CTAB with normal respiratory effort Gastrointestinal: normal bowel sounds, soft, non-tender, no noted masses. No HSM MSK: extremities without edema, joints without erythema or swelling Neurologic:    Mental status is normal.  Gross motor and sensory exams are normal.  No tremor  Commons side effects, risks, benefits, and alternatives for medications and treatment plan prescribed today were discussed, and the patient expressed understanding of the given instructions. Patient is instructed to call or message via MyChart if he/she has any questions or concerns regarding our treatment plan. No barriers to understanding were identified. We discussed Red Flag symptoms and signs in detail. Patient expressed understanding regarding what to do in case of urgent or emergency type symptoms.  Medication list was reconciled, printed and provided to the patient in AVS. Patient instructions and summary information was reviewed with the patient as documented in the AVS. This note was prepared with assistance of Dragon voice recognition software. Occasional wrong-word or sound-a-like substitutions may have occurred due to the inherent limitations of voice recognition software

## 2023-06-21 ENCOUNTER — Encounter: Payer: Self-pay | Admitting: Family Medicine

## 2023-06-21 ENCOUNTER — Other Ambulatory Visit (HOSPITAL_COMMUNITY)
Admission: RE | Admit: 2023-06-21 | Discharge: 2023-06-21 | Disposition: A | Discharge status: Home / Self Care | Payer: Medicare HMO | Source: Ambulatory Visit | Attending: Oncology | Admitting: Oncology

## 2023-06-21 DIAGNOSIS — Z006 Encounter for examination for normal comparison and control in clinical research program: Secondary | ICD-10-CM | POA: Insufficient documentation

## 2023-06-21 NOTE — Progress Notes (Signed)
See mychart note Merry Lofty you are feeling well.  Your labs look great; your Vitamin D is very low though. Please take 4000units daily to rebuild this level. No other changes are needed. Sincerely, Dr. Mardelle Matte

## 2023-07-02 LAB — HELIX MOLECULAR SCREEN: Genetic Analysis Overall Interpretation: NEGATIVE

## 2023-07-02 LAB — GENECONNECT MOLECULAR SCREEN

## 2023-07-11 ENCOUNTER — Encounter: Payer: Self-pay | Admitting: Family Medicine

## 2023-07-18 NOTE — Telephone Encounter (Signed)
Order has been faxed to [facility]

## 2023-07-27 ENCOUNTER — Other Ambulatory Visit: Payer: Self-pay | Admitting: Family Medicine

## 2023-09-21 NOTE — Telephone Encounter (Signed)
Noted

## 2023-09-22 ENCOUNTER — Other Ambulatory Visit: Payer: Self-pay | Admitting: Family Medicine

## 2023-11-19 ENCOUNTER — Other Ambulatory Visit: Payer: Self-pay | Admitting: Family Medicine

## 2023-11-23 ENCOUNTER — Other Ambulatory Visit: Payer: Self-pay | Admitting: Family Medicine

## 2023-12-19 ENCOUNTER — Other Ambulatory Visit: Payer: Self-pay | Admitting: Family Medicine

## 2023-12-20 ENCOUNTER — Ambulatory Visit: Payer: Self-pay

## 2023-12-20 DIAGNOSIS — H524 Presbyopia: Secondary | ICD-10-CM | POA: Diagnosis not present

## 2023-12-20 NOTE — Telephone Encounter (Signed)
  Chief Complaint: Medication question Symptoms: na Frequency: na Pertinent Negatives: Patient denies na Disposition: [] ED /[] Urgent Care (no appt availability in office) / [] Appointment(In office/virtual)/ []  [facility]/ [] Home Care/ [] Refused Recommended Disposition /[] [place] [facility]/ []  Follow-up with PCP Additional Notes:   Called patient she is at the pharmacy now, she states she believes she has it straightened out. They filled her levothyroxine  with brand name Synthroid , they have advised her they can change to generic. Patient wants to make sure Dr. Jonelle Neri didn't order brand name specifically. This Clinical research associate reviewed chart and confirmed order written for levothyroxine . Deatra Face will call back if needed. No further needs at this time.   Copied from CRM [id number]. Topic: Clinical - Medication Question >> Dec 20, 2023  4:33 PM Jenice Mitts wrote: Reason for CRM: Patient would like a call because she went and got her medication. She says she takes levothyroxine  and the pharmacy gave her SYNTHROID . She would like to know if that is correct for her refill since she had to pay out of pocket Reason for Disposition  Caller has medicine question only, adult not sick, AND triager answers question  Protocols used: Medication Question Call-A-AH

## 2023-12-21 ENCOUNTER — Ambulatory Visit

## 2023-12-21 VITALS — Ht 65.0 in | Wt 133.0 lb

## 2023-12-21 DIAGNOSIS — Z0000 Encounter for general adult medical examination without abnormal findings: Secondary | ICD-10-CM

## 2023-12-21 NOTE — Progress Notes (Signed)
 Subjective:   Kristi Jones is a 78 y.o. who presents for a Medicare Wellness preventive visit.  Visit Complete: Virtual I connected with  [name] on 12/21/23 by a audio enabled telemedicine application and verified that I am speaking with the correct person using two identifiers.  Patient Location: Home  Provider Location: Home Office  I discussed the limitations of evaluation and management by telemedicine. The patient expressed understanding and agreed to proceed.  Vital Signs: Because this visit was a virtual/telehealth visit, some criteria may be missing or patient reported. Any vitals not documented were not able to be obtained and vitals that have been documented are patient reported.  VideoDeclined- This patient declined Librarian, academic. Therefore the visit was completed with audio only.  Persons Participating in Visit: Patient.  AWV Questionnaire: Yes: Patient Medicare AWV questionnaire was completed by the patient on 12/17/23; I have confirmed that all information answered by patient is correct and no changes since this date.  Cardiac Risk Factors include: advanced age (>57men, >73 women);dyslipidemia;hypertension     Objective:    Today's Vitals   12/21/23 0830  Weight: 133 lb (60.3 kg)  Height: 5\' 5"  (1.651 m)   Body mass index is 22.13 kg/m.     12/21/2023    8:34 AM 12/07/2022    3:58 PM 12/04/2021    2:21 PM 05/20/2021    7:21 AM 05/14/2021   12:46 PM 11/03/2020   11:20 AM 02/01/2020    8:07 AM  Advanced Directives  Does Patient Have a Medical Advance Directive? Yes Yes Yes Yes Yes Yes No;Yes  Type of Estate agent of [place];Living will Healthcare Power of [place];Living will Healthcare Power of [employer] of [place];Living will Healthcare Power of [place];Living will   Does patient want to make changes to medical advance directive? No - Patient declined No - Patient  declined  No - Patient declined No - Patient declined    Copy of Healthcare Power of Attorney in Chart? Yes - validated most recent copy scanned in chart (See row information) Yes - validated most recent copy scanned in chart (See row information) Yes - validated most recent copy scanned in chart (See row information)  Yes - validated most recent copy scanned in chart (See row information) Yes - validated most recent copy scanned in chart (See row information)   Would patient like information on creating a medical advance directive?    No - Patient declined       Current Medications (verified) Outpatient Encounter Medications as of 12/21/2023  Medication Sig   Cholecalciferol (VITAMIN D3) 125 MCG (5000 UT) CAPS    folic acid (FOLVITE) 1 MG tablet TAKE 1 TABLET ONCE DAILY.   levothyroxine  (SYNTHROID ) 88 MCG tablet TAKE 1 TABLET BY MOUTH DAILY BEFORE BREAKFAST.   losartan -hydrochlorothiazide  (HYZAAR) 100-25 MG tablet Take 1 tablet by mouth daily.   rosuvastatin  (CRESTOR ) 5 MG tablet Take 1 tablet (5 mg total) by mouth at bedtime.   No facility-administered encounter medications on file as of 12/21/2023.    Allergies (verified) Sulfa antibiotics, Sulfa drugs cross reactors, Simvastatin, Penicillins, and Lisinopril   History: Past Medical History:  Diagnosis Date   Arthritis    hands    Hepatitis    hx of Hep A as a child    Hyperlipemia    Hypertension    Hypothyroidism    Osteopenia 10/31/2013   DEXA 01/2013- T+ -2.3 with elvated FRAX of 20.9%,  rec consideration of Barnabas Booth- patient declines now 04/2014/cla   Thyroid  disease    Past Surgical History:  Procedure Laterality Date   APPENDECTOMY     BREAST LUMPECTOMY WITH RADIOACTIVE SEED LOCALIZATION Left 05/20/2021   Procedure: LEFT BREAST LUMPECTOMY WITH RADIOACTIVE SEED LOCALIZATION;  Surgeon: Sim Dryer, MD;  Location: [facility];  Service: General;  Laterality: Left;   BREAST SURGERY  04/2021   HAND SURGERY   2013   left hand due to trauma   HERNIA REPAIR  02/04/2020   I & D EXTREMITY  01/19/2012   Procedure: IRRIGATION AND DEBRIDEMENT EXTREMITY;  Surgeon: Hedy Living, MD;  Location: [facility];  Service: Orthopedics;  Laterality: Left;  with common digital artery repair   INGUINAL HERNIA REPAIR Right 02/04/2020   Procedure: OPEN RIGHT INGUINAL HERNIA REPAIR;  Surgeon: Juanita Norlander, MD;  Location: [facility];  Service: General;  Laterality: Right;   LAPAROTOMY     for endometriosis in 20s    MOUTH SURGERY  09/11/2021   irritation fibromas removed by Dr Vena Gibes per pt   TONSILLECTOMY AND ADENOIDECTOMY     VAGINAL HYSTERECTOMY     vaginal, partial, DUB    Family History  Problem Relation Age of Onset   Alzheimer's disease Mother    Diabetes Mother    Heart disease Mother        CHF    Hypertension Mother    Kidney disease Mother    Heart disease Father    Breast cancer Sister    Hyperlipidemia Brother    Diabetes Brother    Hypertension Brother    Cancer Sister    Social History   Socioeconomic History   Marital status: Married    Spouse name: Not on file   Number of children: 3   Years of education: 16   Highest education level: Bachelor's degree (e.g., BA, AB, BS)  Occupational History   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    Employer: [employer]  Tobacco Use   Smoking status: Never   Smokeless tobacco: Never  Vaping Use   Vaping status: Never Used  Substance and Sexual Activity   Alcohol use: Yes    Alcohol/week: 5.0 standard drinks of alcohol    Types: 5 Glasses of wine per week    Comment: wine and beer occas    Drug use: No   Sexual activity: Yes    Birth control/protection: Post-menopausal  Other Topics Concern   Not on file  Social History Narrative   Physically active with classes at gym    Social Drivers of Health   Financial Resource Strain: Low Risk  (12/17/2023)   Overall Financial Resource Strain (CARDIA)    Difficulty of Paying Living Expenses: Not hard at all   Food Insecurity: No Food Insecurity (12/17/2023)   Hunger Vital Sign    Worried About Running Out of Food in the Last Year: Never true    Ran Out of Food in the Last Year: Never true  Transportation Needs: No Transportation Needs (12/17/2023)   PRAPARE - Administrator, Civil Service (Medical): No    Lack of Transportation (Non-Medical): No  Physical Activity: Sufficiently Active (12/17/2023)   Exercise Vital Sign    Days of Exercise per Week: 5 days    Minutes of Exercise per Session: 60 min  Stress: No Stress Concern Present (12/17/2023)   [employer] of Occupational Health - Occupational Stress Questionnaire    Feeling of Stress : Not at all  Social Connections: Moderately Integrated (12/17/2023)   Social Connection and Isolation Panel [NHANES]    Frequency of Communication with Friends and Family: More than three times a week    Frequency of Social Gatherings with Friends and Family: Three times a week    Attends Religious Services: More than 4 times per year    Active Member of Clubs or Organizations: No    Attends Engineer, structural: Not on file    Marital Status: Married    Tobacco Counseling Counseling given: Not Answered    Clinical Intake:  Pre-visit preparation completed: Yes  Pain : No/denies pain     BMI - recorded: 22.13 Nutritional Status: BMI of 19-24  Normal Diabetes: No  No results found for: "HGBA1C"   How often do you need to have someone help you when you read instructions, pamphlets, or other written materials from your doctor or pharmacy?: 1 - Never  Interpreter Needed?: No  Information entered by :: Lamont Pilsner, LPN   Activities of Daily Living     12/21/2023    8:31 AM  In your present state of health, do you have any difficulty performing the following activities:  Hearing? 0  Vision? 0  Difficulty concentrating or making decisions? 0  Walking or climbing stairs? 0  Dressing or bathing? 0  Doing errands,  shopping? 0  Preparing Food and eating ? N  Using the Toilet? N  In the past six months, have you accidently leaked urine? N  Do you have problems with loss of bowel control? N  Managing your Medications? N  Managing your Finances? N  Housekeeping or managing your Housekeeping? N    Patient Care Team: Luevenia Saha, MD as PCP - General [facility]) Dermatology, Leim Pupa, Edmund Gouge, OD as Consulting Physician (Optometry)  Indicate any recent Medical Services you may have received from other than [facility] providers in the past year (date may be approximate).     Assessment:   This is a routine wellness examination for Myrella.  Hearing/Vision screen Hearing Screening - Comments:: Pt denies any hearing issues  Vision Screening - Comments:: Wears rx glasses - up to date with routine eye exams with [facility]    Goals Addressed             This Visit's Progress    Patient Stated       Maintain health and activity        Depression Screen     12/21/2023    8:33 AM 06/20/2023   10:20 AM 12/07/2022    3:56 PM 06/17/2022    9:53 AM 12/04/2021    2:06 PM 11/03/2020   11:18 AM 05/09/2020   10:02 AM  PHQ 2/9 Scores  PHQ - 2 Score 0 0 0 0 0 0 0    Fall Risk     12/21/2023    8:34 AM 06/20/2023   10:20 AM 12/02/2022    5:49 PM 06/17/2022    9:53 AM 12/04/2021    2:10 PM  Fall Risk   Falls in the past year? 0 0 0 0 0  Number falls in past yr: 0 0 0 0 0  Injury with Fall? 0 0 0 0 0  Risk for fall due to : No Fall Risks No Fall Risks Impaired vision No Fall Risks Impaired vision  Follow up Falls prevention discussed Falls evaluation completed Falls prevention discussed Falls evaluation completed Falls prevention discussed    MEDICARE RISK AT  HOME:  Medicare Risk at Home Any stairs in or around the home?: No If so, are there any without handrails?: No Home free of loose throw rugs in walkways, pet beds, electrical cords, etc?: Yes Adequate lighting in  your home to reduce risk of falls?: Yes Life alert?: No Use of a cane, walker or w/c?: No Grab bars in the bathroom?: Yes Shower chair or bench in shower?: No Elevated toilet seat or a handicapped toilet?: No  TIMED UP AND GO:  Was the test performed?  No  Cognitive Function: 6CIT completed    09/28/2018    2:52 PM  MMSE - Mini Mental State Exam  Orientation to time 5  Orientation to Place 5  Registration 3  Attention/ Calculation 5  Recall 3  Language- name 2 objects 2  Language- repeat 1  Language- follow 3 step command 3  Language- read & follow direction 1  Write a sentence 1  Copy design 1  Total score 30        12/21/2023    8:36 AM 12/07/2022    3:59 PM 12/04/2021    2:11 PM 11/03/2020   11:22 AM  6CIT Screen  What Year? 0 points 0 points 0 points 0 points  What month? 0 points 0 points 0 points 0 points  What time? 0 points 0 points 0 points   Count back from 20 0 points 0 points 0 points 0 points  Months in reverse 0 points 0 points 0 points 0 points  Repeat phrase 0 points 0 points 0 points 0 points  Total Score 0 points 0 points 0 points     Immunizations Immunization History  Administered Date(s) Administered   Fluad Quad(high Dose 65+) 06/12/2021, 05/31/2022   Fluad Trivalent(High Dose 65+) 06/13/2023   Influenza, High Dose Seasonal PF 06/04/2015   Influenza,inj,Quad PF,6+ Mos 05/07/2020, 06/11/2020   Moderna Covid-19 Fall Seasonal Vaccine 29yrs & older 06/18/2023   Moderna Covid-19 Vaccine Bivalent Booster 61yrs & up 06/18/2023   Moderna SARS-COV2 Booster Vaccination 03/10/2021   Moderna Sars-Covid-2 Vaccination 09/05/2019, 10/04/2019, 07/29/2020, 07/14/2021   Tdap 11/09/2010, 01/05/2022   Zoster Recombinant(Shingrix ) 06/17/2021, 09/14/2021    Screening Tests Health Maintenance  Topic Date Due   DEXA SCAN  03/05/2023   COVID-19 Vaccine (6 - 2024-25 season) 08/13/2023   INFLUENZA VACCINE  03/23/2024   MAMMOGRAM  03/29/2024   Medicare Annual  Wellness (AWV)  12/20/2024   DTaP/Tdap/Td (3 - Td or Tdap) 01/06/2032   Hepatitis C Screening  Completed   Zoster Vaccines- Shingrix   Completed   HPV VACCINES  Aged Out   Meningococcal B Vaccine  Aged Out   Pneumonia Vaccine 49+ Years old  Discontinued   Colonoscopy  Discontinued   Fecal DNA (Cologuard)  Discontinued    Health Maintenance  Health Maintenance Due  Topic Date Due   DEXA SCAN  03/05/2023   COVID-19 Vaccine (6 - 2024-25 season) 08/13/2023   Health Maintenance Items Addressed: See Nurse Notes Pt stated dexa scan scheduled for 03/2024  Additional Screening:  Vision Screening: Recommended annual ophthalmology exams for early detection of glaucoma and other disorders of the eye.  Dental Screening: Recommended annual dental exams for proper oral hygiene  Community Resource Referral / Chronic Care Management: CRR required this visit?  No   CCM required this visit?  No     Plan:     I have personally reviewed and noted the following in the patient's chart:   Medical and social history  Use of alcohol, tobacco or illicit drugs  Current medications and supplements including opioid prescriptions. Patient is not currently taking opioid prescriptions. Functional ability and status Nutritional status Physical activity Advanced directives List of other physicians Hospitalizations, surgeries, and ER visits in previous 12 months Vitals Screenings to include cognitive, depression, and falls Referrals and appointments  In addition, I have reviewed and discussed with patient certain preventive protocols, quality metrics, and best practice recommendations. A written personalized care plan for preventive services as well as general preventive health recommendations were provided to patient.     Bruno Capri, LPN   1/61/0960   After Visit Summary: (MyChart) Due to this being a telephonic visit, the after visit summary with patients personalized plan was offered to  patient via MyChart   Notes: Nothing significant to report at this time.

## 2023-12-21 NOTE — Patient Instructions (Signed)
 Ms. Thang , Thank you for taking time to come for your Medicare Wellness Visit. I appreciate your ongoing commitment to your health goals. Please review the following plan we discussed and let me know if I can assist you in the future.   Referrals/Orders/Follow-Ups/Clinician Recommendations: Maintain health and activity   This is a list of the screening recommended for you and due dates:  Health Maintenance  Topic Date Due   DEXA scan (bone density measurement)  03/05/2023   COVID-19 Vaccine (6 - 2024-25 season) 08/13/2023   Medicare Annual Wellness Visit  12/07/2023   Flu Shot  03/23/2024   Mammogram  03/29/2024   DTaP/Tdap/Td vaccine (3 - Td or Tdap) 01/06/2032   Hepatitis C Screening  Completed   Zoster (Shingles) Vaccine  Completed   HPV Vaccine  Aged Out   Meningitis B Vaccine  Aged Out   Pneumonia Vaccine  Discontinued   Colon Cancer Screening  Discontinued   Cologuard (Stool DNA test)  Discontinued    Advanced directives: (In Chart) A copy of your advanced directives are scanned into your chart should your provider ever need it.  Next Medicare Annual Wellness Visit scheduled for next year: Yes

## 2023-12-21 NOTE — Telephone Encounter (Signed)
 Noted.

## 2024-02-26 ENCOUNTER — Emergency Department (HOSPITAL_COMMUNITY)

## 2024-02-26 ENCOUNTER — Encounter (HOSPITAL_COMMUNITY): Payer: Self-pay | Admitting: *Deleted

## 2024-02-26 ENCOUNTER — Other Ambulatory Visit: Payer: Self-pay

## 2024-02-26 ENCOUNTER — Emergency Department (HOSPITAL_COMMUNITY)
Admission: EM | Admit: 2024-02-26 | Discharge: 2024-02-26 | Disposition: A | Discharge status: Home / Self Care | Attending: Emergency Medicine | Admitting: Emergency Medicine

## 2024-02-26 DIAGNOSIS — M79642 Pain in left hand: Secondary | ICD-10-CM | POA: Diagnosis not present

## 2024-02-26 DIAGNOSIS — S61411A Laceration without foreign body of right hand, initial encounter: Secondary | ICD-10-CM | POA: Diagnosis not present

## 2024-02-26 DIAGNOSIS — S51852A Open bite of left forearm, initial encounter: Secondary | ICD-10-CM | POA: Diagnosis not present

## 2024-02-26 DIAGNOSIS — R110 Nausea: Secondary | ICD-10-CM | POA: Diagnosis not present

## 2024-02-26 DIAGNOSIS — Y93K1 Activity, walking an animal: Secondary | ICD-10-CM | POA: Diagnosis not present

## 2024-02-26 DIAGNOSIS — Z79899 Other long term (current) drug therapy: Secondary | ICD-10-CM | POA: Diagnosis not present

## 2024-02-26 DIAGNOSIS — S6990XA Unspecified injury of unspecified wrist, hand and finger(s), initial encounter: Secondary | ICD-10-CM | POA: Diagnosis not present

## 2024-02-26 DIAGNOSIS — I10 Essential (primary) hypertension: Secondary | ICD-10-CM | POA: Insufficient documentation

## 2024-02-26 DIAGNOSIS — W540XXA Bitten by dog, initial encounter: Secondary | ICD-10-CM | POA: Insufficient documentation

## 2024-02-26 DIAGNOSIS — M180 Bilateral primary osteoarthritis of first carpometacarpal joints: Secondary | ICD-10-CM | POA: Diagnosis not present

## 2024-02-26 DIAGNOSIS — S41111A Laceration without foreign body of right upper arm, initial encounter: Secondary | ICD-10-CM | POA: Insufficient documentation

## 2024-02-26 DIAGNOSIS — S41112A Laceration without foreign body of left upper arm, initial encounter: Secondary | ICD-10-CM | POA: Insufficient documentation

## 2024-02-26 DIAGNOSIS — S61412A Laceration without foreign body of left hand, initial encounter: Secondary | ICD-10-CM | POA: Diagnosis not present

## 2024-02-26 DIAGNOSIS — S51812A Laceration without foreign body of left forearm, initial encounter: Secondary | ICD-10-CM | POA: Insufficient documentation

## 2024-02-26 DIAGNOSIS — S51811A Laceration without foreign body of right forearm, initial encounter: Secondary | ICD-10-CM | POA: Insufficient documentation

## 2024-02-26 DIAGNOSIS — S4492XA Injury of unspecified nerve at shoulder and upper arm level, left arm, initial encounter: Secondary | ICD-10-CM | POA: Insufficient documentation

## 2024-02-26 DIAGNOSIS — R609 Edema, unspecified: Secondary | ICD-10-CM | POA: Diagnosis not present

## 2024-02-26 DIAGNOSIS — W5581XA Bitten by other mammals, initial encounter: Secondary | ICD-10-CM | POA: Diagnosis not present

## 2024-02-26 DIAGNOSIS — M7989 Other specified soft tissue disorders: Secondary | ICD-10-CM | POA: Diagnosis not present

## 2024-02-26 DIAGNOSIS — S41151A Open bite of right upper arm, initial encounter: Secondary | ICD-10-CM | POA: Diagnosis not present

## 2024-02-26 DIAGNOSIS — M79632 Pain in left forearm: Secondary | ICD-10-CM | POA: Diagnosis not present

## 2024-02-26 MED ORDER — CLINDAMYCIN PHOSPHATE 300 MG/50ML IV SOLN
300.0000 mg | Freq: Once | INTRAVENOUS | Status: AC
  Administered 2024-02-26: 300 mg via INTRAVENOUS
  Filled 2024-02-26: qty 50

## 2024-02-26 MED ORDER — OXYCODONE-ACETAMINOPHEN 5-325 MG PO TABS: 1.0000 | ORAL_TABLET | Freq: Four times a day (QID) | ORAL | 0 refills | Status: DC | PRN

## 2024-02-26 MED ORDER — LIDOCAINE HCL (PF) 1 % IJ SOLN
10.0000 mL | Freq: Once | INTRAMUSCULAR | Status: AC
  Administered 2024-02-26: 10 mL
  Filled 2024-02-26: qty 30

## 2024-02-26 MED ORDER — HYDROMORPHONE HCL 1 MG/ML IJ SOLN
0.5000 mg | Freq: Once | INTRAMUSCULAR | Status: AC
  Administered 2024-02-26: 0.5 mg via INTRAVENOUS
  Filled 2024-02-26: qty 1

## 2024-02-26 MED ORDER — CLINDAMYCIN HCL 300 MG PO CAPS: 300.0000 mg | ORAL_CAPSULE | Freq: Three times a day (TID) | ORAL | 0 refills | Status: AC

## 2024-02-26 MED ORDER — MORPHINE SULFATE (PF) 4 MG/ML IV SOLN
4.0000 mg | Freq: Once | INTRAVENOUS | Status: AC
  Administered 2024-02-26: 4 mg via INTRAVENOUS
  Filled 2024-02-26: qty 1

## 2024-02-26 NOTE — ED Provider Notes (Signed)
 [place] EMERGENCY DEPARTMENT AT [facility] Provider Note   CSN: [id number] Arrival date & time: 02/26/24  0900     Patient presents with: Animal Bite   Kristi Jones is a 78 y.o. female.   Pt came in with a c/o several dog bites that occurred this morning at 7: 30 am when she was walking her dogs. Pt tryied stopping the dogs from fighting each other when the dogs bit her. She has several dog bites in both her arms which were bleeding. Pt denies being bitten in any other region of her body. She denies falling and hitting her head on the ground. Pt denies any fevers, HA, nausea, vomiting, dizziness, CP, SOB. She is UTD with her tetanus vaccination. The dogs have been vaccinated against rabies.    Animal Bite      Prior to Admission medications   Medication Sig Start Date End Date Taking? Authorizing Provider  Cholecalciferol (VITAMIN D3) 125 MCG (5000 UT) CAPS  06/20/23   [provider]  folic acid (FOLVITE) 1 MG tablet TAKE 1 TABLET ONCE DAILY. 08/06/22   Jodie Lavern CROME, MD  levothyroxine  (SYNTHROID ) 88 MCG tablet TAKE 1 TABLET BY MOUTH DAILY BEFORE BREAKFAST. 12/19/23   Jodie Lavern CROME, MD  losartan -hydrochlorothiazide  (HYZAAR) 100-25 MG tablet Take 1 tablet by mouth daily. 06/20/23   Jodie Lavern CROME, MD  rosuvastatin  (CRESTOR ) 5 MG tablet Take 1 tablet (5 mg total) by mouth at bedtime. 06/20/23   Jodie Lavern CROME, MD    Allergies: Sulfa antibiotics, Sulfa drugs cross reactors, Simvastatin, Penicillins, and Lisinopril    Review of Systems  Updated Vital Signs BP 121/62 (BP Location: Left Arm)   Pulse (!) 57   Temp 98.3 F (36.8 C) (Oral)   Resp 18   Wt 60.3 kg   SpO2 95%   BMI 22.13 kg/m   Physical Exam Constitutional:      General: She is in acute distress.  HENT:     Head: Normocephalic.  Cardiovascular:     Rate and Rhythm: Normal rate and regular rhythm.  Pulmonary:     Effort: Pulmonary effort is normal.     Breath sounds: Normal  breath sounds.  Abdominal:     Tenderness: There is no abdominal tenderness. There is no guarding or rebound.     Hernia: No hernia is present.     Comments: No bite bite marks or lacerations seen  Skin:    Findings: Bruising present.     Comments: lacerations on average of 1-2 cm present on both distal forearms: 5-6 lacerations on the right arm 1-2 lacerations on the left along with bite marks. Lacerations were covered . with dried and oozing blood   Neurological:     General: No focal deficit present.     Mental Status: She is alert and oriented to person, place, and time.     Cranial Nerves: No cranial nerve deficit.     Sensory: No sensory deficit.     Motor: No weakness.     (all labs ordered are listed, but only abnormal results are displayed) Labs Reviewed - No data to display  EKG: None  Radiology: No results found.   Procedures   Medications Ordered in the ED - No data to display                                  Medical Decision Making  This patient presents to the ED for concern of multiple dog bites in both arms, this involves an extensive number of treatment options, and is a complaint that carries with it a high risk of complications and morbidity.  The differential diagnosis includes animal bite, lacerations, cellulitis, lymphangitis.    Co morbidities that complicate the patient evaluation  Age related osteoporosis, HLD, HTN   Additional history obtained:  All History obtained from the patient herself.  Imaging Studies ordered:  I ordered imaging studies including complete x-rays of both hands and forearms.  I independently visualized and interpreted imaging which showed: no acute fracture or dislocation in either forearm or hand. Possible superficial small foreign bodies in the distal right forearm and hand. Soft tissue emphysema in the distal right forearm and to a lesser extent in the left hand.  I agree with the radiologist  interpretation.  Medicines ordered and prescription drug management:  I ordered medication including morphine , dilaudid , clindamycin , lidocaine  for pain and possible infection. Reevaluation of the patient after these medicines showed that the patient's pain improved a little bit as compared to when she came in.  I have reviewed the patients home medicines and have made adjustments as needed   Problem List / ED Course: Pt came in with a complain of dog bites on both her arms. She was afebrile, normotensive, and in immense pain. On her physical exam we observed 1) about 6 bite marks and lacerations in her distal right forearm (2) about 1-2 lacerations and bite marks in her distal left forearm. Pain was worse on her right hand. During her ED visit, pt was given morphine , lidocaine , Dilaudid  in the order as her pain progressed. Pt was given clindamycin  to prevent infections as she was allergic to penicillins. Lacerations on both hands were irrigated well. We decided not to suture her lacerations as they were smaller. Steri strips were placed on some of the lacerations on either arm.    Reevaluation:  After the interventions noted above, I reevaluated the patient and found that she was still in a lot of pain but it was lesser than what she had come in with. She was in pain but looked stable to be discharged   Dispostion:  After consideration of the diagnostic results and the patients response to treatment, I feel that the patient would benefit from bring discharged home. She was discharged with clindamycin  for treatment and prevention of infections and percocet for pain.  Amount and/or Complexity of Data Reviewed Radiology: ordered.  Risk Prescription drug management.     Final diagnoses:  None    ED Discharge Orders     None          Edgardo Pontiff, DO 02/26/24 1509    Dean Clarity, MD 02/29/24 [phone]

## 2024-02-26 NOTE — ED Triage Notes (Signed)
 BIB [facility] from home s/p multiple Dog bites to bilateral hands, wrists and FAs. Bleeding controlled. C/c is R wrist and FA. Td UTD. Denies DM or blood thinners. Dogs are her own, canine vaccines UTD. Vet is at [facility] in [place]. Bites were from 1 large shepherd and 2 smaller Terriers. States, Was breaking up a fight amongst dogs and they turned on her. CMS and ROM intact. Compartments soft. VSS. Fentanyl  and zofran  given PTA. NSL 20g L AC. Alert, NAD, calm, interactive.

## 2024-02-26 NOTE — Discharge Instructions (Addendum)
-  You were seen for multiple dog bites on both your arms -Your lacerations were small to be closed by sutures so we put steri strips on some of them -Take Clindamycin  as precribed for potential infections -Take take percocet for pain -Please go to the nearest ED if signs of infections like fever, feeling of passing out, excruciating pain, excessive amounts pus development in the wounds.

## 2024-03-05 ENCOUNTER — Ambulatory Visit: Admitting: Family Medicine

## 2024-03-05 ENCOUNTER — Encounter: Payer: Self-pay | Admitting: Family Medicine

## 2024-03-05 VITALS — BP 118/81 | HR 98 | Temp 98.5°F | Ht 65.0 in | Wt 124.0 lb

## 2024-03-05 DIAGNOSIS — S61451S Open bite of right hand, sequela: Secondary | ICD-10-CM

## 2024-03-05 DIAGNOSIS — S6991XD Unspecified injury of right wrist, hand and finger(s), subsequent encounter: Secondary | ICD-10-CM | POA: Diagnosis not present

## 2024-03-05 DIAGNOSIS — W540XXS Bitten by dog, sequela: Secondary | ICD-10-CM

## 2024-03-05 NOTE — Progress Notes (Signed)
 Subjective  CC:  Chief Complaint  Patient presents with   Hospitalization Follow-up    02/26/2024 (5 hours) [place] [facility] at [facility]   Pt stated that her dogs got into a fight and she tried to break it up and in the mist of it all she got bit.Todat is her first day out of bed since the incident     HPI: Kristi Jones is a 78 y.o. female who presents to the office today to address the problems listed above in the chief complaint. Discussed the use of AI scribe software for clinical note transcription with the patient, who gave verbal consent to proceed.  History of Present Illness Kristi Jones is a 78 year old female who presents with severe right hand pain following a dog-related injury.  She has been experiencing severe pain in her right hand since an incident involving her dogs on Sunday, February 26, 2024. The pain is described as severe and atypical. She suspects a twisting injury may have occurred during the incident.  Plain x-rays were negative, but there is concern about potential ligament or tendon damage. The pain is localized and affects her ability to move her fingers properly, with an inability to fully flex or extend them. She has tried using an ace bandage and a sling without relief. The patient thinks there may be internal swelling.  For pain management, she received 150 mcg of fentanyl  in increments by ambulance staff, followed by morphine , lidocaine , and Dilaudid  in the emergency department, which did not alleviate the pain. She has been taking Percocet, approximately six doses on Monday and Tuesday, and is currently managing with 600 mg of Imitrex every six hours. Icing the area has become bothersome.  The incident occurred while she was trying to separate her large dog, Hilma, and her small [place] during an Environmental education officer. She notes that she did not follow her regular routine that morning, which contributed to the  incident.  She has a history of receiving IV Clindamycin  in the hospital and completed a seven-day course of antibiotics, which she believes contributed to a weight loss of nine pounds. She reports no head injury during the incident, although she was in the mud and experienced trauma.  She is alert, and eating and drinking better now. The clinda caused stomach upset and a metallic taste in her mouth. She is managing pain with ibuprofen . No f/c/s. Bite wounds are clean and dry   Assessment  1. Hand injury, right, subsequent encounter   2. Dog bite, hand, right, sequela      Plan  Assessment and Plan Assessment & Plan Right hand pain and swelling Severe pain and swelling post-twisting injury. X-rays negative for fractures; possible ligament or tendon injury. Pain from wrist to fingers, difficulty in movement, especially finger flexion. Persistent pain despite Percocet and Imitrex. Immobilization required. - Provide wrist splint for immobilization or sling - Refer to hand specialist for further evaluation.  Recent trauma with potential infection Trauma from dog altercation with potential tissue damage. Completed IV Clindamycin  and seven-day course. No sign of infection now Pain management Pain managed now with ibuprofen     Follow up: prn Orders Placed This Encounter  Procedures   Ambulatory referral to Hand Surgery   No orders of the defined types were placed in this encounter.    I reviewed the patients updated PMH, FH, and SocHx.  Patient Active Problem List   Diagnosis Date Noted   Mixed hyperlipidemia 05/10/2014  Priority: High   Essential hypertension 10/31/2013    Priority: High   Acquired hypothyroidism 10/31/2013    Priority: High   Breast fibroadenoma, left 11/08/2014    Priority: Medium    Age-related osteoporosis without current pathological fracture 10/31/2013    Priority: Medium    Sebaceous cyst, upper back 06/15/2012    Priority: Low   Intraductal  papilloma of breast, left 06/15/2021    Priority: Medium    Atypical ductal hyperplasia of left breast 06/15/2021    Priority: Medium    Diverticular disease of colon 05/24/2023   Hemangioma of spleen 07/13/2021   Pneumococcal vaccine refused 01/24/2019   Current Meds  Medication Sig   Cholecalciferol (VITAMIN D3) 125 MCG (5000 UT) CAPS    folic acid (FOLVITE) 1 MG tablet TAKE 1 TABLET ONCE DAILY.   levothyroxine  (SYNTHROID ) 88 MCG tablet TAKE 1 TABLET BY MOUTH DAILY BEFORE BREAKFAST.   losartan -hydrochlorothiazide  (HYZAAR) 100-25 MG tablet Take 1 tablet by mouth daily.   oxyCODONE -acetaminophen  (PERCOCET/ROXICET) 5-325 MG tablet Take 1 tablet by mouth every 6 (six) hours as needed for severe pain (pain score 7-10).   rosuvastatin  (CRESTOR ) 5 MG tablet Take 1 tablet (5 mg total) by mouth at bedtime.   Allergies: Patient is allergic to sulfa antibiotics, sulfa drugs cross reactors, simvastatin, ciprofloxacin, penicillins, and lisinopril. Family History: Patient family history includes Alzheimer's disease in her mother; Breast cancer in her sister; Cancer in her sister; Diabetes in her brother and mother; Heart disease in her father and mother; Hyperlipidemia in her brother; Hypertension in her brother and mother; Kidney disease in her mother. Social History:  Patient  reports that she has never smoked. She has never used smokeless tobacco. She reports current alcohol use of about 5.0 standard drinks of alcohol per week. She reports that she does not use drugs.  Review of Systems: Constitutional: Negative for fever malaise or anorexia Cardiovascular: negative for chest pain Respiratory: negative for SOB or persistent cough Gastrointestinal: negative for abdominal pain  Objective  Vitals: BP 118/81   Pulse 98   Temp 98.5 F (36.9 C)   Ht 5' 5 (1.651 m)   Wt 124 lb (56.2 kg)   SpO2 98%   BMI 20.63 kg/m  General: no acute distress , A&but appears tired Right hand: multiple  wounds, dry. Ecchymosis and hand swelling. Limited range of motion at wrist and fingers. Nl pulses.  Left hand and arm: ecchymosis and few wounds but normal function of hand Commons side effects, risks, benefits, and alternatives for medications and treatment plan prescribed today were discussed, and the patient expressed understanding of the given instructions. Patient is instructed to call or message via MyChart if he/she has any questions or concerns regarding our treatment plan. No barriers to understanding were identified. We discussed Red Flag symptoms and signs in detail. Patient expressed understanding regarding what to do in case of urgent or emergency type symptoms.  Medication list was reconciled, printed and provided to the patient in AVS. Patient instructions and summary information was reviewed with the patient as documented in the AVS. This note was prepared with assistance of Dragon voice recognition software. Occasional wrong-word or sound-a-like substitutions may have occurred due to the inherent limitations of voice recognition software

## 2024-03-07 DIAGNOSIS — S61451A Open bite of right hand, initial encounter: Secondary | ICD-10-CM | POA: Diagnosis not present

## 2024-03-07 DIAGNOSIS — M79641 Pain in right hand: Secondary | ICD-10-CM | POA: Diagnosis not present

## 2024-03-07 DIAGNOSIS — M25641 Stiffness of right hand, not elsewhere classified: Secondary | ICD-10-CM | POA: Diagnosis not present

## 2024-03-07 DIAGNOSIS — M25531 Pain in right wrist: Secondary | ICD-10-CM | POA: Diagnosis not present

## 2024-03-07 DIAGNOSIS — M25631 Stiffness of right wrist, not elsewhere classified: Secondary | ICD-10-CM | POA: Diagnosis not present

## 2024-03-07 DIAGNOSIS — W540XXA Bitten by dog, initial encounter: Secondary | ICD-10-CM | POA: Diagnosis not present

## 2024-03-14 DIAGNOSIS — M25531 Pain in right wrist: Secondary | ICD-10-CM | POA: Diagnosis not present

## 2024-03-14 DIAGNOSIS — S61451D Open bite of right hand, subsequent encounter: Secondary | ICD-10-CM | POA: Diagnosis not present

## 2024-03-14 DIAGNOSIS — M25631 Stiffness of right wrist, not elsewhere classified: Secondary | ICD-10-CM | POA: Diagnosis not present

## 2024-03-14 DIAGNOSIS — W540XXD Bitten by dog, subsequent encounter: Secondary | ICD-10-CM | POA: Diagnosis not present

## 2024-03-14 DIAGNOSIS — M25641 Stiffness of right hand, not elsewhere classified: Secondary | ICD-10-CM | POA: Diagnosis not present

## 2024-03-14 DIAGNOSIS — S4491XD Injury of unspecified nerve at shoulder and upper arm level, right arm, subsequent encounter: Secondary | ICD-10-CM | POA: Diagnosis not present

## 2024-03-14 DIAGNOSIS — M79641 Pain in right hand: Secondary | ICD-10-CM | POA: Diagnosis not present

## 2024-03-22 DIAGNOSIS — M79641 Pain in right hand: Secondary | ICD-10-CM | POA: Diagnosis not present

## 2024-03-22 DIAGNOSIS — M25631 Stiffness of right wrist, not elsewhere classified: Secondary | ICD-10-CM | POA: Diagnosis not present

## 2024-03-22 DIAGNOSIS — M25641 Stiffness of right hand, not elsewhere classified: Secondary | ICD-10-CM | POA: Diagnosis not present

## 2024-03-22 DIAGNOSIS — M25531 Pain in right wrist: Secondary | ICD-10-CM | POA: Diagnosis not present

## 2024-03-27 DIAGNOSIS — W540XXD Bitten by dog, subsequent encounter: Secondary | ICD-10-CM | POA: Diagnosis not present

## 2024-03-27 DIAGNOSIS — S4491XD Injury of unspecified nerve at shoulder and upper arm level, right arm, subsequent encounter: Secondary | ICD-10-CM | POA: Diagnosis not present

## 2024-03-27 DIAGNOSIS — S61451D Open bite of right hand, subsequent encounter: Secondary | ICD-10-CM | POA: Diagnosis not present

## 2024-04-04 DIAGNOSIS — M79641 Pain in right hand: Secondary | ICD-10-CM | POA: Diagnosis not present

## 2024-04-04 DIAGNOSIS — M25531 Pain in right wrist: Secondary | ICD-10-CM | POA: Diagnosis not present

## 2024-04-04 DIAGNOSIS — M25641 Stiffness of right hand, not elsewhere classified: Secondary | ICD-10-CM | POA: Diagnosis not present

## 2024-04-04 DIAGNOSIS — M25631 Stiffness of right wrist, not elsewhere classified: Secondary | ICD-10-CM | POA: Diagnosis not present

## 2024-04-04 LAB — HM DEXA SCAN

## 2024-04-11 DIAGNOSIS — M25631 Stiffness of right wrist, not elsewhere classified: Secondary | ICD-10-CM | POA: Diagnosis not present

## 2024-04-11 DIAGNOSIS — M25531 Pain in right wrist: Secondary | ICD-10-CM | POA: Diagnosis not present

## 2024-04-11 DIAGNOSIS — M25641 Stiffness of right hand, not elsewhere classified: Secondary | ICD-10-CM | POA: Diagnosis not present

## 2024-04-11 DIAGNOSIS — M79641 Pain in right hand: Secondary | ICD-10-CM | POA: Diagnosis not present

## 2024-04-16 DIAGNOSIS — Z01818 Encounter for other preprocedural examination: Secondary | ICD-10-CM | POA: Diagnosis not present

## 2024-04-16 DIAGNOSIS — Z1231 Encounter for screening mammogram for malignant neoplasm of breast: Secondary | ICD-10-CM | POA: Diagnosis not present

## 2024-04-18 DIAGNOSIS — M79641 Pain in right hand: Secondary | ICD-10-CM | POA: Diagnosis not present

## 2024-04-18 DIAGNOSIS — M25531 Pain in right wrist: Secondary | ICD-10-CM | POA: Diagnosis not present

## 2024-04-18 DIAGNOSIS — M25631 Stiffness of right wrist, not elsewhere classified: Secondary | ICD-10-CM | POA: Diagnosis not present

## 2024-04-18 DIAGNOSIS — M25641 Stiffness of right hand, not elsewhere classified: Secondary | ICD-10-CM | POA: Diagnosis not present

## 2024-04-25 DIAGNOSIS — M79641 Pain in right hand: Secondary | ICD-10-CM | POA: Diagnosis not present

## 2024-04-25 DIAGNOSIS — M25641 Stiffness of right hand, not elsewhere classified: Secondary | ICD-10-CM | POA: Diagnosis not present

## 2024-04-25 DIAGNOSIS — Z1231 Encounter for screening mammogram for malignant neoplasm of breast: Secondary | ICD-10-CM | POA: Diagnosis not present

## 2024-04-25 DIAGNOSIS — M25631 Stiffness of right wrist, not elsewhere classified: Secondary | ICD-10-CM | POA: Diagnosis not present

## 2024-04-25 DIAGNOSIS — M25531 Pain in right wrist: Secondary | ICD-10-CM | POA: Diagnosis not present

## 2024-04-25 LAB — HM MAMMOGRAPHY

## 2024-04-27 ENCOUNTER — Encounter: Payer: Self-pay | Admitting: Family Medicine

## 2024-04-30 DIAGNOSIS — S61451D Open bite of right hand, subsequent encounter: Secondary | ICD-10-CM | POA: Diagnosis not present

## 2024-04-30 DIAGNOSIS — S4491XD Injury of unspecified nerve at shoulder and upper arm level, right arm, subsequent encounter: Secondary | ICD-10-CM | POA: Diagnosis not present

## 2024-04-30 DIAGNOSIS — W540XXD Bitten by dog, subsequent encounter: Secondary | ICD-10-CM | POA: Diagnosis not present

## 2024-05-02 DIAGNOSIS — M25641 Stiffness of right hand, not elsewhere classified: Secondary | ICD-10-CM | POA: Diagnosis not present

## 2024-05-02 DIAGNOSIS — M79641 Pain in right hand: Secondary | ICD-10-CM | POA: Diagnosis not present

## 2024-05-02 DIAGNOSIS — M25631 Stiffness of right wrist, not elsewhere classified: Secondary | ICD-10-CM | POA: Diagnosis not present

## 2024-05-02 DIAGNOSIS — M25531 Pain in right wrist: Secondary | ICD-10-CM | POA: Diagnosis not present

## 2024-05-09 DIAGNOSIS — M79641 Pain in right hand: Secondary | ICD-10-CM | POA: Diagnosis not present

## 2024-05-09 DIAGNOSIS — M25641 Stiffness of right hand, not elsewhere classified: Secondary | ICD-10-CM | POA: Diagnosis not present

## 2024-05-09 DIAGNOSIS — M25531 Pain in right wrist: Secondary | ICD-10-CM | POA: Diagnosis not present

## 2024-05-09 DIAGNOSIS — M25631 Stiffness of right wrist, not elsewhere classified: Secondary | ICD-10-CM | POA: Diagnosis not present

## 2024-05-21 DIAGNOSIS — M25631 Stiffness of right wrist, not elsewhere classified: Secondary | ICD-10-CM | POA: Diagnosis not present

## 2024-05-21 DIAGNOSIS — M25641 Stiffness of right hand, not elsewhere classified: Secondary | ICD-10-CM | POA: Diagnosis not present

## 2024-05-21 DIAGNOSIS — M79641 Pain in right hand: Secondary | ICD-10-CM | POA: Diagnosis not present

## 2024-05-21 DIAGNOSIS — M25531 Pain in right wrist: Secondary | ICD-10-CM | POA: Diagnosis not present

## 2024-05-24 ENCOUNTER — Other Ambulatory Visit: Payer: Self-pay | Admitting: Family Medicine

## 2024-05-28 DIAGNOSIS — M25531 Pain in right wrist: Secondary | ICD-10-CM | POA: Diagnosis not present

## 2024-05-28 DIAGNOSIS — M79641 Pain in right hand: Secondary | ICD-10-CM | POA: Diagnosis not present

## 2024-05-28 DIAGNOSIS — M25631 Stiffness of right wrist, not elsewhere classified: Secondary | ICD-10-CM | POA: Diagnosis not present

## 2024-05-28 DIAGNOSIS — M25641 Stiffness of right hand, not elsewhere classified: Secondary | ICD-10-CM | POA: Diagnosis not present

## 2024-06-04 DIAGNOSIS — M25641 Stiffness of right hand, not elsewhere classified: Secondary | ICD-10-CM | POA: Diagnosis not present

## 2024-06-04 DIAGNOSIS — M25631 Stiffness of right wrist, not elsewhere classified: Secondary | ICD-10-CM | POA: Diagnosis not present

## 2024-06-04 DIAGNOSIS — M25531 Pain in right wrist: Secondary | ICD-10-CM | POA: Diagnosis not present

## 2024-06-04 DIAGNOSIS — M79641 Pain in right hand: Secondary | ICD-10-CM | POA: Diagnosis not present

## 2024-06-13 DIAGNOSIS — S4491XD Injury of unspecified nerve at shoulder and upper arm level, right arm, subsequent encounter: Secondary | ICD-10-CM | POA: Diagnosis not present

## 2024-06-13 DIAGNOSIS — S61451D Open bite of right hand, subsequent encounter: Secondary | ICD-10-CM | POA: Diagnosis not present

## 2024-06-13 DIAGNOSIS — W540XXD Bitten by dog, subsequent encounter: Secondary | ICD-10-CM | POA: Diagnosis not present

## 2024-06-13 DIAGNOSIS — M25641 Stiffness of right hand, not elsewhere classified: Secondary | ICD-10-CM | POA: Diagnosis not present

## 2024-06-13 DIAGNOSIS — M25531 Pain in right wrist: Secondary | ICD-10-CM | POA: Diagnosis not present

## 2024-06-13 DIAGNOSIS — M79641 Pain in right hand: Secondary | ICD-10-CM | POA: Diagnosis not present

## 2024-06-13 DIAGNOSIS — M25631 Stiffness of right wrist, not elsewhere classified: Secondary | ICD-10-CM | POA: Diagnosis not present

## 2024-06-25 ENCOUNTER — Encounter: Payer: Self-pay | Admitting: Family Medicine

## 2024-06-25 ENCOUNTER — Ambulatory Visit: Payer: Medicare HMO | Admitting: Family Medicine

## 2024-06-25 VITALS — BP 128/64 | HR 78 | Temp 98.2°F | Ht 65.0 in | Wt 124.6 lb

## 2024-06-25 DIAGNOSIS — Z0000 Encounter for general adult medical examination without abnormal findings: Secondary | ICD-10-CM | POA: Diagnosis not present

## 2024-06-25 DIAGNOSIS — I10 Essential (primary) hypertension: Secondary | ICD-10-CM

## 2024-06-25 DIAGNOSIS — Z780 Asymptomatic menopausal state: Secondary | ICD-10-CM | POA: Diagnosis not present

## 2024-06-25 DIAGNOSIS — M810 Age-related osteoporosis without current pathological fracture: Secondary | ICD-10-CM | POA: Diagnosis not present

## 2024-06-25 DIAGNOSIS — E559 Vitamin D deficiency, unspecified: Secondary | ICD-10-CM | POA: Diagnosis not present

## 2024-06-25 DIAGNOSIS — E782 Mixed hyperlipidemia: Secondary | ICD-10-CM

## 2024-06-25 DIAGNOSIS — N6092 Unspecified benign mammary dysplasia of left breast: Secondary | ICD-10-CM

## 2024-06-25 DIAGNOSIS — E039 Hypothyroidism, unspecified: Secondary | ICD-10-CM | POA: Diagnosis not present

## 2024-06-25 DIAGNOSIS — Z0001 Encounter for general adult medical examination with abnormal findings: Secondary | ICD-10-CM

## 2024-06-25 LAB — COMPREHENSIVE METABOLIC PANEL WITH GFR
ALT: 18 U/L (ref 0–35)
AST: 19 U/L (ref 0–37)
Albumin: 4.2 g/dL (ref 3.5–5.2)
Alkaline Phosphatase: 65 U/L (ref 39–117)
BUN: 21 mg/dL (ref 6–23)
CO2: 30 meq/L (ref 19–32)
Calcium: 9.3 mg/dL (ref 8.4–10.5)
Chloride: 103 meq/L (ref 96–112)
Creatinine, Ser: 0.85 mg/dL (ref 0.40–1.20)
GFR: 65.99 mL/min (ref >60.00)
Glucose, Bld: 99 mg/dL (ref 70–99)
Potassium: 3.5 meq/L (ref 3.5–5.1)
Sodium: 142 meq/L (ref 135–145)
Total Bilirubin: 0.6 mg/dL (ref 0.2–1.2)
Total Protein: 6.7 g/dL (ref 6.0–8.3)

## 2024-06-25 LAB — CBC WITH DIFFERENTIAL/PLATELET
Basophils Absolute: 0 K/uL (ref 0.0–0.1)
Basophils Relative: 0.5 % (ref 0.0–3.0)
Eosinophils Absolute: 0.1 K/uL (ref 0.0–0.7)
Eosinophils Relative: 2.3 % (ref 0.0–5.0)
HCT: 33 % — ABNORMAL LOW (ref 36.0–46.0)
Hemoglobin: 11.3 g/dL — ABNORMAL LOW (ref 12.0–15.0)
Lymphocytes Relative: 31.1 % (ref 12.0–46.0)
Lymphs Abs: 1.7 K/uL (ref 0.7–4.0)
MCHC: 34.2 g/dL (ref 30.0–36.0)
MCV: 83.7 fl (ref 78.0–100.0)
Monocytes Absolute: 0.4 K/uL (ref 0.1–1.0)
Monocytes Relative: 8 % (ref 3.0–12.0)
Neutro Abs: 3.2 K/uL (ref 1.4–7.7)
Neutrophils Relative %: 58.1 % (ref 43.0–77.0)
Platelets: 171 K/uL (ref 150.0–400.0)
RBC: 3.94 Mil/uL (ref 3.87–5.11)
RDW: 14.3 % (ref 11.5–15.5)
WBC: 5.5 K/uL (ref 4.0–10.5)

## 2024-06-25 LAB — LIPID PANEL
Cholesterol: 152 mg/dL (ref 0–200)
HDL: 53.9 mg/dL (ref >39.00)
LDL Cholesterol: 73 mg/dL (ref 0–99)
NonHDL: 98.26
Total CHOL/HDL Ratio: 3
Triglycerides: 127 mg/dL (ref 0.0–149.0)
VLDL: 25.4 mg/dL (ref 0.0–40.0)

## 2024-06-25 LAB — VITAMIN D 25 HYDROXY (VIT D DEFICIENCY, FRACTURES): VITD: 64.85 ng/mL (ref 30.00–100.00)

## 2024-06-25 LAB — TSH: TSH: 1.09 u[IU]/mL (ref 0.35–5.50)

## 2024-06-25 NOTE — Progress Notes (Signed)
 Subjective  Chief Complaint  Patient presents with   Annual Exam    Pt here for Annual Exam and is currently fasting. DEXA was done at [facility]. Retrieving records    Hypertension    HPI: Kristi Jones is a 78 y.o. female who presents to [facility] [place] today for a Female Wellness Visit. She also has the concerns and/or needs as listed above in the chief complaint. These will be addressed in addition to the Health Maintenance Visit.   Wellness Visit: annual visit with health maintenance review and exam  HM: Had mammogram at [facility].  Reports she also had a bone density scan.  I do not have those results, we requested them.  Immunizations are up-to-date.  Eye exam up-to-date.  [place]  [place]  Enjoyed it immensely.  Chronic disease f/u and/or acute problem visit: (deemed necessary to be done in addition to the wellness visit): Discussed the use of AI scribe software for clinical note transcription with the patient, who gave verbal consent to proceed.  History of Present Illness Kristi Jones is a 78 year old female who presents for follow-up on hand injury and therapy progress.  Hand neuropathy and functional impairment - Sustained a hand injury four months ago due to a twisting mechanism resulting in nerve damage from dog bite.  See last notes.  See hand surgeon notes.  I been following along. - No pain present - Significant functional limitations including difficulty with grip and fine motor tasks such as buttoning a shirt - Recently resumed driving after regaining some control, but continues to struggle with quick reactions - Ongoing occupational therapy with next specialist appointment scheduled for January - Able to return to work in a limited capacity, performing tasks that do not require fine motor skills such as home visits and computer work  Shoulder and wrist mobility limitation -  Experiencing limitations due to a frozen shoulder, which developed from guarding the hand injury - Performing exercises to improve shoulder and wrist mobility - Has resumed some physical activities, including yoga, and plans to return to body balance classes  Appetite and weight changes - Experienced significant weight loss, reaching 120 pounds, attributed to lack of appetite and gastrointestinal upset from medications including clindamycin , doxycycline, and Flagyl - Appetite has improved and currently eating well  Hypothyroidism on levothyroxine  will need to recheck dose.  She says she feels well energy levels are good and she is compliant.  Blood pressure and hyperlipidemia have been well-controlled on medications.  Due for recheck.  Osteoporosis, had been treated with Fosamax for 5 years, year 3 of drug holiday.  Will reevaluate her bone density and restart medications if indicated.  Check vitamin D  levels.    Assessment  1. Encounter for well adult exam with abnormal findings   2. Acquired hypothyroidism   3. Essential hypertension   4. Mixed hyperlipidemia   5. Atypical ductal hyperplasia of left breast   6. Age-related osteoporosis without current pathological fracture   7. Asymptomatic menopausal state   8. Vitamin D  deficiency      Plan  Female Wellness Visit: Age appropriate Health Maintenance and Prevention measures were discussed with patient. Included topics are cancer screening recommendations, ways to keep healthy (see AVS) including dietary and exercise recommendations, regular eye and dental care, use of seat belts, and avoidance of moderate alcohol use and tobacco use.  BMI: discussed patient's BMI and encouraged positive  lifestyle modifications to help get to or maintain a target BMI. HM needs and immunizations were addressed and ordered. See below for orders. See HM and immunization section for updates. Routine labs and screening tests ordered including cmp, cbc and  lipids where appropriate. Discussed recommendations regarding Vit D and calcium  supplementation (see AVS)  Chronic disease management visit and/or acute problem visit: Assessment and Plan Assessment & Plan Adult Wellness Visit Routine wellness visit with well-controlled blood pressure. Recent travel to the [facility] with no acute issues. Engaged in yoga and plans to resume body pump exercises. - Encouraged regular physical activity, including yoga and body pump exercises  Nerve injury of hand, neuropraxia of the right hand Chronic nerve injury of the hand due to twisting trauma, resulting in impaired grip strength and difficulty with fine motor tasks. Undergoing occupational therapy with improvement. No pain reported. Cleared to return to work with modified duties. - Continue occupational therapy for hand rehabilitation - Continue modified work duties as tolerated  Frozen shoulder Secondary to guarding behavior following hand injury. Improvement noted with exercises learned in therapy. - Continue exercises for shoulder mobility, including those performed in the shower  Age-related osteoporosis Bone density test performed in August, results not yet received. Previous issues with scheduling and communication from the imaging center. - Will obtain bone density results from the imaging center - Will follow up on bone density results, restart medications if indicated.  Would consider Prolia.  Hypertension is well-controlled on current medications.  Check renal function electrolytes  Hyperlipidemia on rosuvastatin  for recheck today.  Fasting state.  Check LFTs  Hypothyroidism: Clinically stable.  Will recheck levels on levothyroxine .  Adjust if indicated.  Recheck vitamin D  with history of vitamin D  deficiency.    Follow up: 12 months for recheck and CPE Orders Placed This Encounter  Procedures   VITAMIN D  25 Hydroxy (Vit-D Deficiency, Fractures)   CBC with  Differential/Platelet   Comprehensive metabolic panel with GFR   Lipid panel   TSH   No orders of the defined types were placed in this encounter.     Body mass index is 20.73 kg/m. Wt Readings from Last 3 Encounters:  06/25/24 124 lb 9.6 oz (56.5 kg)  03/05/24 124 lb (56.2 kg)  02/26/24 133 lb (60.3 kg)     Patient Active Problem List   Diagnosis Date Noted   Mixed hyperlipidemia 05/10/2014    Priority: High    Failed simvastatin due to myalgias/ tolerates crestor  5    Essential hypertension 10/31/2013    Priority: High   Acquired hypothyroidism 10/31/2013    Priority: High   Breast fibroadenoma, left 11/08/2014    Priority: Medium     Left subareolar.  Status post biopsy    Age-related osteoporosis without current pathological fracture 10/31/2013    Priority: Medium     DEXA 01/2013 - T - -2.3 with elevated FRAX of 20.9%; rec consideration of fosamax - pt declines now 04/2014/cla DEXA 11/2014 T= - 2.5 with elevated FRZS of 23% and elevated hip frac 4% - Fosamax started/cla DEXA 01/2017: T=-2.2, lowest; 4% improved in femur, 11% improvement in spine on fosamax/ 12/2017 DEXA 01/2019: R femur neck T-Score: -2.20 lowest on fosamax. Stable on meds. Continue 02/2019/cla DEXA 01/2021: [facility]: osteopenia w/ decrease in a few areas. Has been on fosamax for 5 years, rec drug holiday and recheck in 2 years. 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 No furthre eval needed.     Pneumococcal vaccine refused 01/24/2019   Health Maintenance  Topic Date Due   DEXA SCAN  03/05/2023    COVID-19 Vaccine (6 - 2025-26 season) 07/11/2024 (Originally 04/23/2024)   Influenza Vaccine  11/20/2024 (Originally 03/23/2024)   Medicare Annual Wellness (AWV)  12/20/2024   Mammogram  04/25/2025   DTaP/Tdap/Td (3 - Td or Tdap) 01/06/2032   Hepatitis C Screening  Completed   Zoster Vaccines- Shingrix   Completed   Meningococcal B Vaccine  Aged Out   Pneumococcal Vaccine: 50+ Years  Discontinued   Colonoscopy  Discontinued   Fecal DNA (Cologuard)  Discontinued   Immunization History  Administered Date(s) Administered   Fluad Quad(high Dose 65+) 06/12/2021, 05/31/2022   Fluad Trivalent(High Dose 65+) 06/13/2023   INFLUENZA, HIGH DOSE SEASONAL PF 06/04/2015   Influenza,inj,Quad PF,6+ Mos 05/07/2020, 06/11/2020   Moderna Covid-19 Fall Seasonal Vaccine 69yrs & older 06/18/2023   Moderna Covid-19 Vaccine Bivalent Booster 5yrs & up 06/18/2023   Moderna SARS-COV2 Booster Vaccination 03/10/2021   Moderna Sars-Covid-2 Vaccination 09/05/2019, 10/04/2019, 07/29/2020, 07/14/2021   Tdap 11/09/2010, 01/05/2022   Zoster Recombinant(Shingrix ) 06/17/2021, 09/14/2021   We updated and reviewed the patient's past history in detail and it is documented below. Allergies: Patient is allergic to sulfa antibiotics, sulfa drugs cross reactors, simvastatin, ciprofloxacin, penicillins, and lisinopril. Past Medical History Patient  has a past medical history of Arthritis, Hepatitis, Hyperlipemia, Hypertension, Hypothyroidism, Osteopenia (10/31/2013), and Thyroid  disease. Past Surgical History Patient  has a past surgical history that includes I & D extremity (01/19/2012); Appendectomy; Hand surgery (2013); Vaginal hysterectomy; Tonsillectomy and adenoidectomy; laparotomy; Inguinal hernia repair (Right, 02/04/2020); Breast lumpectomy with radioactive seed localization (Left, 05/20/2021); Mouth surgery (09/11/2021); Breast surgery (04/2021); and Hernia repair (02/04/2020). Family History: Patient family history  includes Alzheimer's disease in her mother; Breast cancer in her sister; Cancer in her sister; Diabetes in her brother and mother; Heart disease in her father and mother; Hyperlipidemia in her brother; Hypertension in her brother and mother; Kidney disease in her mother. Social History:  Patient  reports that she has never smoked. She has never used smokeless tobacco. She reports current alcohol use of about 2.0 standard drinks of alcohol per week. She reports that she does not use drugs.  Review of Systems: Constitutional: negative for fever or malaise Ophthalmic: negative for photophobia, double vision or loss of vision Cardiovascular: negative for chest pain, dyspnea on exertion, or new LE swelling Respiratory: negative for SOB or persistent cough Gastrointestinal: negative for abdominal pain, change in bowel habits or melena Genitourinary: negative for dysuria or gross hematuria, no abnormal uterine bleeding or disharge Musculoskeletal: negative for new gait disturbance or muscular weakness Integumentary: negative for new or persistent rashes, no breast lumps Neurological: negative for TIA or stroke symptoms Psychiatric: negative for SI or delusions Allergic/Immunologic: negative for hives  Patient Care Team    Relationship Specialty Notifications Start End  Jodie Lavern CROME, MD PCP - General Family Medicine  01/10/18   Dermatology, [facility]    09/28/18   Cleotilde Elspeth CROME, OD Consulting Physician Optometry  10/05/19  Objective  Vitals: BP 128/64   Pulse 78   Temp 98.2 F (36.8 C)   Ht 5' 5 (1.651 m)   Wt 124 lb 9.6 oz (56.5 kg)   SpO2 99%   BMI 20.73 kg/m  General:  Well developed, well nourished, no acute distress  Psych:  Alert and orientedx3,normal mood and affect HEENT:  Normocephalic, atraumatic, non-icteric sclera,  supple neck without adenopathy, mass or thyromegaly Cardiovascular:  Normal S1, S2, RRR without gallop, rub or murmur Respiratory:  Good breath sounds  bilaterally, CTAB with normal respiratory effort Gastrointestinal: normal bowel sounds, soft, non-tender, no noted masses. No HSM MSK: extremities without edema, joints without erythema or swelling Neurologic:    Mental status is normal.  Gross motor and sensory exams are normal.  Except for right hand, decreased wrist and grip strength no tremor  Commons side effects, risks, benefits, and alternatives for medications and treatment plan prescribed today were discussed, and the patient expressed understanding of the given instructions. Patient is instructed to call or message via MyChart if he/she has any questions or concerns regarding our treatment plan. No barriers to understanding were identified. We discussed Red Flag symptoms and signs in detail. Patient expressed understanding regarding what to do in case of urgent or emergency type symptoms.  Medication list was reconciled, printed and provided to the patient in AVS. Patient instructions and summary information was reviewed with the patient as documented in the AVS. This note was prepared with assistance of Dragon voice recognition software. Occasional wrong-word or sound-a-like substitutions may have occurred due to the inherent limitations of voice recognition software

## 2024-06-25 NOTE — Patient Instructions (Signed)
 Please return in 12 months for complete physical and for follow up on chronic medical conditions   I will release your lab results to you on your MyChart account with further instructions. You may see the results before I do, but when I review them I will send you a message with my report or have my assistant call you if things need to be discussed. Please reply to my message with any questions. Thank you!   If you have any questions or concerns, please don't hesitate to send me a message via MyChart or call the office at [phone]. Thank you for visiting with us  today! It's our pleasure caring for you.    VISIT SUMMARY: Today, you had a follow-up appointment to discuss your hand injury and therapy progress. We also reviewed your shoulder and wrist mobility, appetite and weight changes, and conducted a routine wellness visit.  YOUR PLAN: -NERVE INJURY OF HAND: You have a chronic nerve injury in your hand from a twisting trauma, which has affected your grip strength and ability to perform fine motor tasks. You are currently undergoing occupational therapy and have shown improvement. You should continue with occupational therapy and your modified work duties as tolerated.  -FROZEN SHOULDER: Your frozen shoulder developed as a result of guarding your hand injury, which has limited your shoulder and wrist mobility. You have shown improvement with exercises learned in therapy. Continue performing these exercises, including those you do in the shower.  -AGE-RELATED OSTEOPOROSIS: Osteoporosis is a condition where bones become weak and brittle with age. We are waiting for the results of your bone density test performed in August. We will follow up on these results once they are obtained.  -ADULT WELLNESS VISIT: Your routine wellness visit showed well-controlled blood pressure. You have been engaging in yoga and plan to resume body pump exercises. Continue with regular physical activity, including yoga  and body pump exercises.  INSTRUCTIONS: Please continue with your occupational therapy and modified work duties for your hand injury. Keep up with your shoulder mobility exercises, especially those in the shower. We will follow up on your bone density test results once they are available. Maintain your regular physical activities, including yoga and body pump exercises.                      Contains text generated by [employer]bridge.                                 Contains text generated by [employer]bridge.  s

## 2024-06-27 DIAGNOSIS — M25641 Stiffness of right hand, not elsewhere classified: Secondary | ICD-10-CM | POA: Diagnosis not present

## 2024-06-27 DIAGNOSIS — M25631 Stiffness of right wrist, not elsewhere classified: Secondary | ICD-10-CM | POA: Diagnosis not present

## 2024-06-27 DIAGNOSIS — M79641 Pain in right hand: Secondary | ICD-10-CM | POA: Diagnosis not present

## 2024-06-27 DIAGNOSIS — M25531 Pain in right wrist: Secondary | ICD-10-CM | POA: Diagnosis not present

## 2024-07-03 ENCOUNTER — Other Ambulatory Visit: Payer: Self-pay | Admitting: Family Medicine

## 2024-07-04 DIAGNOSIS — M79641 Pain in right hand: Secondary | ICD-10-CM | POA: Diagnosis not present

## 2024-07-04 DIAGNOSIS — M25631 Stiffness of right wrist, not elsewhere classified: Secondary | ICD-10-CM | POA: Diagnosis not present

## 2024-07-04 DIAGNOSIS — M25641 Stiffness of right hand, not elsewhere classified: Secondary | ICD-10-CM | POA: Diagnosis not present

## 2024-07-04 DIAGNOSIS — M25531 Pain in right wrist: Secondary | ICD-10-CM | POA: Diagnosis not present

## 2024-07-06 ENCOUNTER — Ambulatory Visit: Payer: Self-pay | Admitting: Family Medicine

## 2024-07-06 NOTE — Progress Notes (Signed)
 See mychart note Kristi Jones, Your lab results look good except you have new mild anemia. Perhaps nutritional due to your hand injury and change in eating patterns plus absorption issues due to the antibiotics.  Ensure not seeing any blood in the stool; if not, then try otc iron and a vitamin B12 supplement daily.  We should recheck this in about 8 weeks please.  Sincerely, Dr. Jodie

## 2024-07-11 DIAGNOSIS — M25631 Stiffness of right wrist, not elsewhere classified: Secondary | ICD-10-CM | POA: Diagnosis not present

## 2024-07-11 DIAGNOSIS — M79641 Pain in right hand: Secondary | ICD-10-CM | POA: Diagnosis not present

## 2024-07-11 DIAGNOSIS — M25641 Stiffness of right hand, not elsewhere classified: Secondary | ICD-10-CM | POA: Diagnosis not present

## 2024-07-11 DIAGNOSIS — M25531 Pain in right wrist: Secondary | ICD-10-CM | POA: Diagnosis not present

## 2024-07-16 NOTE — Telephone Encounter (Signed)
 Noted

## 2024-07-18 DIAGNOSIS — M25631 Stiffness of right wrist, not elsewhere classified: Secondary | ICD-10-CM | POA: Diagnosis not present

## 2024-07-18 DIAGNOSIS — M25641 Stiffness of right hand, not elsewhere classified: Secondary | ICD-10-CM | POA: Diagnosis not present

## 2024-07-18 DIAGNOSIS — M79641 Pain in right hand: Secondary | ICD-10-CM | POA: Diagnosis not present

## 2024-07-18 DIAGNOSIS — M25531 Pain in right wrist: Secondary | ICD-10-CM | POA: Diagnosis not present

## 2024-07-25 DIAGNOSIS — M25631 Stiffness of right wrist, not elsewhere classified: Secondary | ICD-10-CM | POA: Diagnosis not present

## 2024-07-25 DIAGNOSIS — M25641 Stiffness of right hand, not elsewhere classified: Secondary | ICD-10-CM | POA: Diagnosis not present

## 2024-07-25 DIAGNOSIS — M79641 Pain in right hand: Secondary | ICD-10-CM | POA: Diagnosis not present

## 2024-07-25 DIAGNOSIS — M25531 Pain in right wrist: Secondary | ICD-10-CM | POA: Diagnosis not present

## 2024-07-29 ENCOUNTER — Ambulatory Visit: Payer: Self-pay | Admitting: Family Medicine

## 2024-07-29 DIAGNOSIS — M810 Age-related osteoporosis without current pathological fracture: Secondary | ICD-10-CM

## 2024-07-29 NOTE — Progress Notes (Signed)
 See mychart note.  DEXA 01/2021: [facility]: osteopenia w/ decrease in a few areas. Has been on fosamax  for 5 years, rec drug holiday and recheck in 2 years. [facility] DEXA 03/2024: [facility]: osteopenia with very elevated FRAX scores off of fosamax  x 2-3 years; rec restarting fosamax  or prolia.   Kristi Jones, I received your bone density results from the scan you had in August.  After review, I recommend consideration of restarting Fosamax  or starting Prolia since your results show slight worsening off of the medications.  I can order medication or we can have a visit to discuss.   Sincerely, Dr. Jodie

## 2024-07-30 MED ORDER — ALENDRONATE SODIUM 70 MG PO TABS: 70.0000 mg | ORAL_TABLET | ORAL | 3 refills | Status: AC

## 2024-08-01 DIAGNOSIS — M25631 Stiffness of right wrist, not elsewhere classified: Secondary | ICD-10-CM | POA: Diagnosis not present

## 2024-08-01 DIAGNOSIS — M79641 Pain in right hand: Secondary | ICD-10-CM | POA: Diagnosis not present

## 2024-08-01 DIAGNOSIS — M25531 Pain in right wrist: Secondary | ICD-10-CM | POA: Diagnosis not present

## 2024-08-08 DIAGNOSIS — M79641 Pain in right hand: Secondary | ICD-10-CM | POA: Diagnosis not present

## 2024-08-08 DIAGNOSIS — M25631 Stiffness of right wrist, not elsewhere classified: Secondary | ICD-10-CM | POA: Diagnosis not present

## 2024-08-08 DIAGNOSIS — M25531 Pain in right wrist: Secondary | ICD-10-CM | POA: Diagnosis not present

## 2024-08-08 DIAGNOSIS — M25641 Stiffness of right hand, not elsewhere classified: Secondary | ICD-10-CM | POA: Diagnosis not present

## 2024-09-06 ENCOUNTER — Other Ambulatory Visit: Payer: Self-pay

## 2024-09-06 ENCOUNTER — Telehealth: Payer: Self-pay

## 2024-09-06 DIAGNOSIS — D649 Anemia, unspecified: Secondary | ICD-10-CM

## 2024-09-06 NOTE — Telephone Encounter (Signed)
 Patient is scheduled for Labs 11/01/24 (8 weeks)

## 2024-09-06 NOTE — Telephone Encounter (Signed)
 Copied from CRM [id number]. Topic: Clinical - Request for Lab/Test Order >> Sep 06, 2024  9:08 AM Logan F wrote: Reason for CRM: Pt called to schedule her repeat B12 labs. She was due to come back this month but the orders are not in,. Please call pt to schedule  [phone]  Please Advise  Message sent to pcp to address

## 2024-11-01 ENCOUNTER — Other Ambulatory Visit

## 2024-12-27 ENCOUNTER — Ambulatory Visit

## 2025-06-27 ENCOUNTER — Encounter: Admitting: Family Medicine
